# Patient Record
Sex: Female | Born: 1980
Health system: Southern US, Community
[De-identification: ages and names within clinical notes are randomized; demographics above are authoritative.]

## PROBLEM LIST (undated history)

## (undated) DIAGNOSIS — S2249XA Multiple fractures of ribs, unspecified side, initial encounter for closed fracture: Secondary | ICD-10-CM

## (undated) DIAGNOSIS — S82209A Unspecified fracture of shaft of unspecified tibia, initial encounter for closed fracture: Secondary | ICD-10-CM

## (undated) DIAGNOSIS — S82409A Unspecified fracture of shaft of unspecified fibula, initial encounter for closed fracture: Secondary | ICD-10-CM

## (undated) DIAGNOSIS — S62109A Fracture of unspecified carpal bone, unspecified wrist, initial encounter for closed fracture: Secondary | ICD-10-CM

## (undated) DIAGNOSIS — G43909 Migraine, unspecified, not intractable, without status migrainosus: Secondary | ICD-10-CM

## (undated) DIAGNOSIS — R112 Nausea with vomiting, unspecified: Secondary | ICD-10-CM

## (undated) DIAGNOSIS — Z9889 Other specified postprocedural states: Secondary | ICD-10-CM

## (undated) DIAGNOSIS — J45909 Unspecified asthma, uncomplicated: Secondary | ICD-10-CM

## (undated) DIAGNOSIS — M8430XA Stress fracture, unspecified site, initial encounter for fracture: Secondary | ICD-10-CM

## (undated) DIAGNOSIS — S2239XA Fracture of one rib, unspecified side, initial encounter for closed fracture: Secondary | ICD-10-CM

## (undated) HISTORY — DX: Fracture of one rib, unspecified side, initial encounter for closed fracture: S22.39XA

## (undated) HISTORY — DX: Other specified postprocedural states: R11.2

## (undated) HISTORY — DX: Stress fracture, unspecified site, initial encounter for fracture: M84.30XA

## (undated) HISTORY — DX: Unspecified fracture of shaft of unspecified tibia, initial encounter for closed fracture: S82.209A

## (undated) HISTORY — DX: Migraine, unspecified, not intractable, without status migrainosus: G43.909

## (undated) HISTORY — DX: Multiple fractures of ribs, unspecified side, initial encounter for closed fracture: S22.49XA

## (undated) HISTORY — DX: Other specified postprocedural states: Z98.890

## (undated) HISTORY — DX: Unspecified fracture of shaft of unspecified tibia, initial encounter for closed fracture: S82.409A

## (undated) HISTORY — DX: Unspecified asthma, uncomplicated: J45.909

## (undated) HISTORY — DX: Fracture of unspecified carpal bone, unspecified wrist, initial encounter for closed fracture: S62.109A

---
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
## 2002-09-29 ENCOUNTER — Other Ambulatory Visit: Admission: RE | Admit: 2002-09-29 | Discharge: 2002-09-29 | Payer: Self-pay | Admitting: Obstetrics and Gynecology

## 2003-10-29 ENCOUNTER — Other Ambulatory Visit: Admission: RE | Admit: 2003-10-29 | Discharge: 2003-10-29 | Payer: Self-pay | Admitting: Obstetrics and Gynecology

## 2005-01-19 ENCOUNTER — Other Ambulatory Visit: Admission: RE | Admit: 2005-01-19 | Discharge: 2005-01-19 | Payer: Self-pay | Admitting: Obstetrics and Gynecology

## 2006-03-02 ENCOUNTER — Other Ambulatory Visit: Admission: RE | Admit: 2006-03-02 | Discharge: 2006-03-02 | Payer: Self-pay | Admitting: Obstetrics and Gynecology

## 2006-11-09 ENCOUNTER — Emergency Department (HOSPITAL_COMMUNITY): Admission: EM | Admit: 2006-11-09 | Discharge: 2006-11-09 | Payer: Self-pay | Admitting: Family Medicine

## 2007-02-28 ENCOUNTER — Other Ambulatory Visit: Admission: RE | Admit: 2007-02-28 | Discharge: 2007-02-28 | Payer: Self-pay | Admitting: Obstetrics and Gynecology

## 2008-01-26 ENCOUNTER — Emergency Department (HOSPITAL_BASED_OUTPATIENT_CLINIC_OR_DEPARTMENT_OTHER): Admission: EM | Admit: 2008-01-26 | Discharge: 2008-01-26 | Payer: Self-pay | Admitting: Emergency Medicine

## 2008-03-08 ENCOUNTER — Other Ambulatory Visit: Admission: RE | Admit: 2008-03-08 | Discharge: 2008-03-08 | Payer: Self-pay | Admitting: Obstetrics & Gynecology

## 2008-06-15 HISTORY — PX: OTHER SURGICAL HISTORY: SHX169

## 2008-07-12 ENCOUNTER — Ambulatory Visit (HOSPITAL_COMMUNITY): Admission: RE | Admit: 2008-07-12 | Discharge: 2008-07-12 | Payer: Self-pay | Admitting: Neurological Surgery

## 2008-07-12 ENCOUNTER — Encounter: Payer: Self-pay | Admitting: Orthopedic Surgery

## 2008-07-17 ENCOUNTER — Ambulatory Visit (HOSPITAL_COMMUNITY): Admission: RE | Admit: 2008-07-17 | Discharge: 2008-07-17 | Payer: Self-pay | Admitting: Orthopedic Surgery

## 2010-02-13 IMAGING — RF DG ANKLE 2V *R*
1 series · 2 of 2 positions shown · non-contrast
Comparison: CT of the right ankle of 07/12/2008

CLINICAL DATA: Fixation of distal tibial fracture

RIGHT ANKLE - 2 VIEW

[Series 1: run · 2 of 2 slices shown]
[im 1/2]
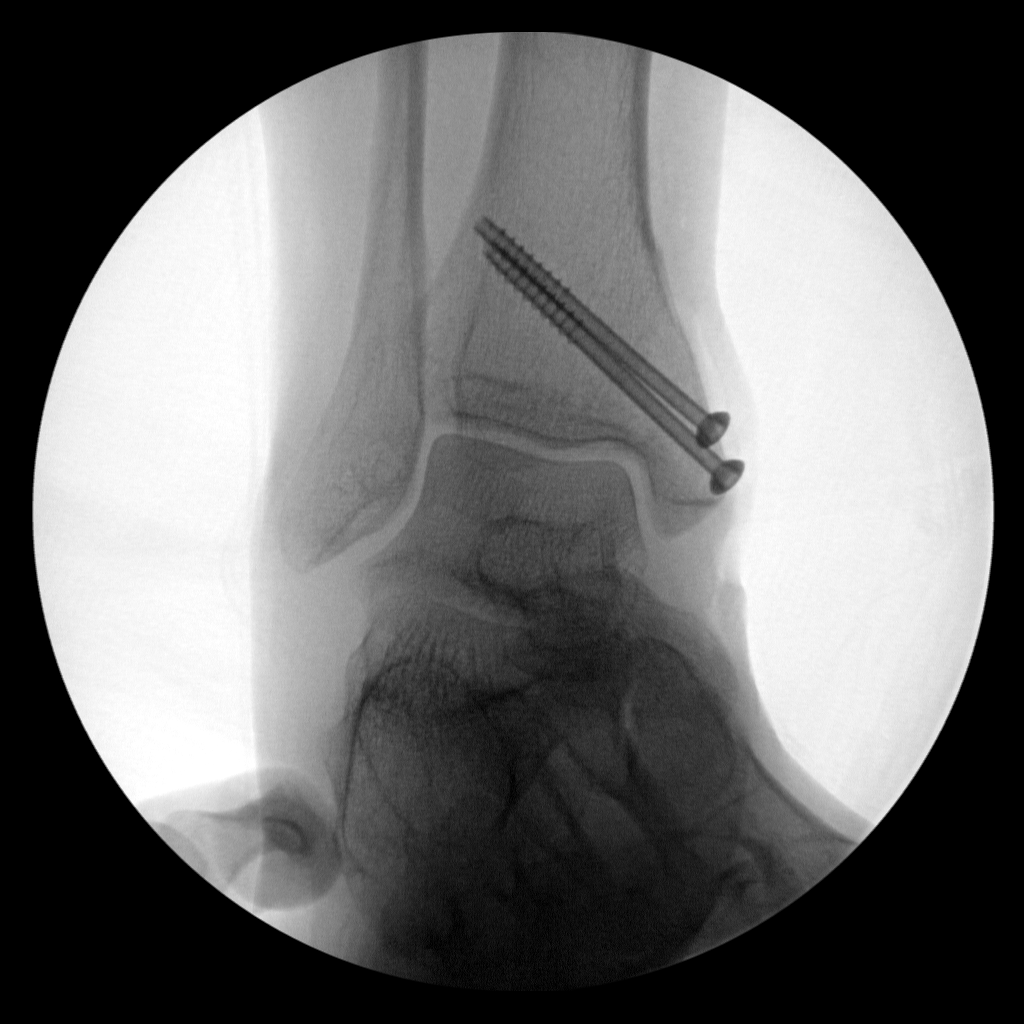
[im 2/2]
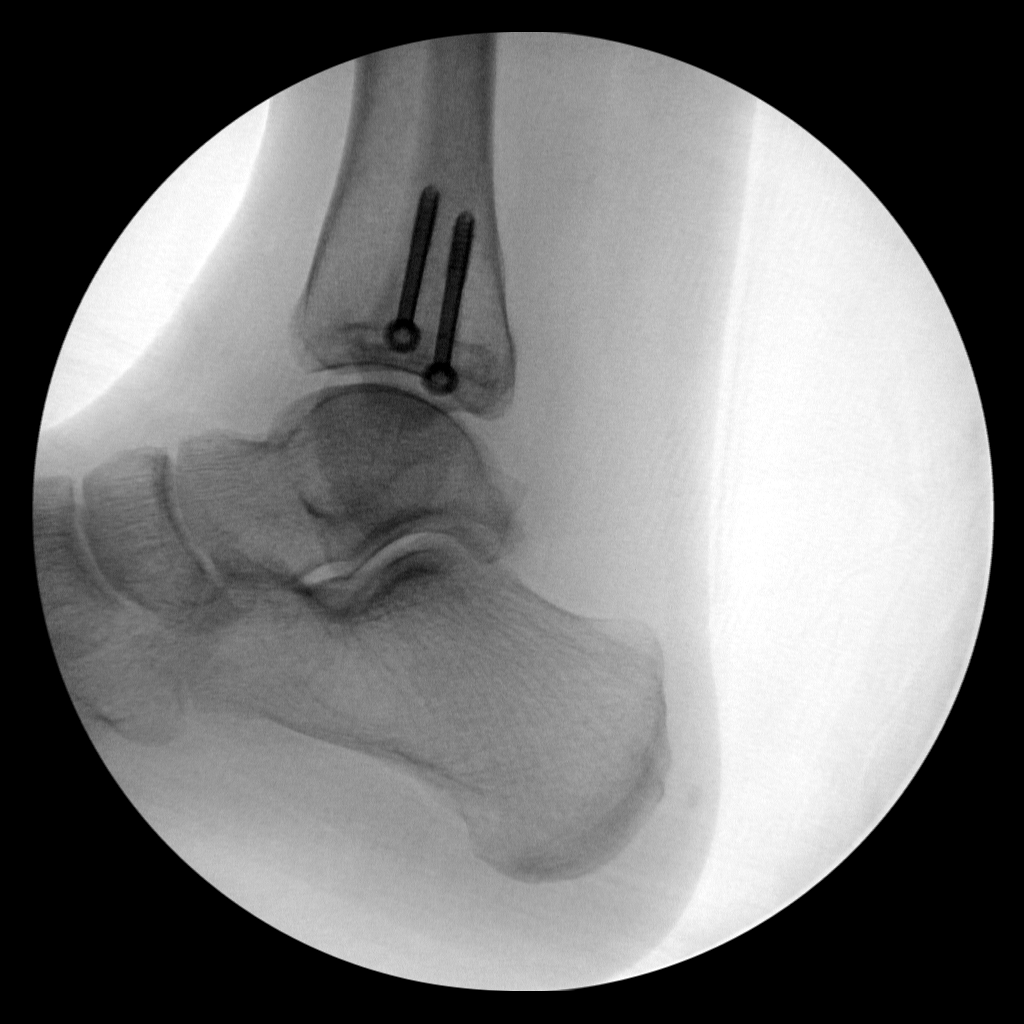

[2 of 2 positions shown; findings below may reference images not displayed]

FINDINGS: Two C-arm spot films show placement of two screws for
fixation of the distal tibial fracture.
IMPRESSION: Fixation of distal right tibial fracture.

REF:W2 DICTATED: 07/17/2008 [DATE]

## 2010-07-06 ENCOUNTER — Encounter: Payer: Self-pay | Admitting: Orthopedic Surgery

## 2010-09-30 LAB — URINALYSIS, ROUTINE W REFLEX MICROSCOPIC
Bilirubin Urine: NEGATIVE
Ketones, ur: NEGATIVE mg/dL
Nitrite: NEGATIVE
Urobilinogen, UA: 0.2 mg/dL (ref 0.0–1.0)
pH: 5.5 (ref 5.0–8.0)

## 2010-09-30 LAB — DIFFERENTIAL
Basophils Relative: 0 % (ref 0–1)
Eosinophils Absolute: 0.1 10*3/uL (ref 0.0–0.7)
Eosinophils Relative: 1 % (ref 0–5)
Monocytes Absolute: 0.4 10*3/uL (ref 0.1–1.0)
Monocytes Relative: 6 % (ref 3–12)
Neutrophils Relative %: 67 % (ref 43–77)

## 2010-09-30 LAB — PREGNANCY, URINE: Preg Test, Ur: NEGATIVE

## 2010-09-30 LAB — BASIC METABOLIC PANEL
CO2: 22 mEq/L (ref 19–32)
Chloride: 109 mEq/L (ref 96–112)
Creatinine, Ser: 0.74 mg/dL (ref 0.4–1.2)
GFR calc Af Amer: >60 mL/min (ref >60)

## 2010-09-30 LAB — CBC
MCHC: 33.4 g/dL (ref 30.0–36.0)
MCV: 88.3 fL (ref 78.0–100.0)
RBC: 4.95 MIL/uL (ref 3.87–5.11)

## 2010-10-28 NOTE — Op Note (Signed)
NAMEBRUCE, Katrina Parrish               ACCOUNT NO.:  [id number]   MEDICAL RECORD NO.:  [id number]          PATIENT TYPE:  AMB   LOCATION:  DAY                          FACILITY:  [facility]   PHYSICIAN:  Madlyn Frankel. Charlann Boxer, M.D.  DATE OF BIRTH:  08-15-1980   DATE OF PROCEDURE:  07/17/2008  DATE OF DISCHARGE:  07/17/2008                               OPERATIVE REPORT   PREOPERATIVE DIAGNOSIS:  Right bimalleolar ankle fracture with  displacement of the medial fragment extending down to the shoulder the  tibial tear articulation with some minor comminution.   POSTOPERATIVE DIAGNOSIS:  Right bimalleolar ankle fracture with  displacement of the medial fragment extending down to the shoulder the  tibial tear articulation with some minor comminution.   PROCEDURE PERFORMED:  Open reduction and internal fixation of right  bimalleolar ankle fracture with cannulated screw fixation of medial  malleolar segment.   SURGEON:  Madlyn Frankel. Charlann Boxer, M.D.   ASSISTANT:  Yetta Glassman. Mann, PA   ANESTHESIA:  General.   BLOOD LOSS:  Minimal.   TOURNIQUET TIME:  45 minutes at 2 mmHg.   INDICATIONS FOR PROCEDURE:  Gertrue is a 30 year old female who was riding  a horse when she was bucked off, landing onto her feet.  She had a  rolling mechanism injury to her right ankle.  She was seen and evaluated  in the emergency room, placed into a splint.  She was seen and evaluated  in follow-up in the office.  Radiographs at that time had revealed  concern for displacement of the fracture.  CT scan was ordered  confirming some displacement, as well as some minor comminution of the  shoulder portion of the joint and without significant fibular fracture  or injury.  Given these findings with displacement and intra-articular  involvement, it was felt best to stabilize this at her age to provide  security for fixation and reduced displacement.  Risks of infection,  stiffness, and need for removal of hardware were all discussed  and  reviewed.  Consent was obtained.   PROCEDURE IN DETAIL:  The patient was brought to the operative theater  where adequate anesthesia, preoperative antibiotics and Ancef were  administered.  The patient was positioned supine.  Time-out was  performed identifying the patient and extremity.  Thigh tourniquet was  used.   The right lower extremity pre scrubbed, prepped and draped in sterile  fashion.  The leg was exsanguinated, tourniquet elevated to 200 mmHg.  Landmarks were identified.  A slight J-type incision was made over the  medial malleolar segment.  Sharp dissection carried down to the extensor  and retinacular layer as well as periosteal layer.  This layer was then  elevated sharply.  Fracture site identified on fluoroscopic imaging.  Using a bone tenaculum I was able to reduce the fracture into anatomic  position to the mortise.  With it reduced I placed the 2 guide pins  across the fracture from the anterior medial to posterior lateral  direction, perpendicular to the  fracture.  I then used the countersink  device to try to sink the screw  head in the medial bone to prevent  excessive prominence of the screws.  I then placed two wires for the  4-  0 cannulated screws.  There was excellent reduction of the fracture and  maintenance of the reduction with the removal of the bone tenaculum  under fluoroscopic imaging.  The screws were then placed.  The wounds  were irrigated.  I was able to reapproximate this retinacular tissue  over the top of the screw heads.  The screws was still slightly palpable  at this point.  The remainder of the wound was closed with 3-0 Vicryl  and 3-0 nylon on the skin edges.  The skin was cleaned, dried, dressed  sterilely with Xeroform and bulky wrap.  It was then covered with a L  and U splint.  The patient was awoken from anesthesia,  brought to the  recovery room in stable condition, having tolerated the procedure well.   It should be noted  that she will be seen in followup.  She will be non-  weight-bearing until seen in follow up.  She will be non-weight-bearing  for the 5 weeks.  Pain medicine was prescribed.      Madlyn Frankel Charlann Boxer, M.D.  Electronically Signed     MDO/MEDQ  D:  07/17/2008  T:  07/18/2008  Job:  [id number]

## 2013-01-02 ENCOUNTER — Ambulatory Visit (INDEPENDENT_AMBULATORY_CARE_PROVIDER_SITE_OTHER): Payer: PRIVATE HEALTH INSURANCE | Admitting: Certified Nurse Midwife

## 2013-01-02 ENCOUNTER — Encounter: Payer: Self-pay | Admitting: Certified Nurse Midwife

## 2013-01-02 ENCOUNTER — Telehealth: Payer: Self-pay | Admitting: Certified Nurse Midwife

## 2013-01-02 VITALS — BP 110/60 | HR 80 | Resp 18 | Ht 65.0 in | Wt 128.8 lb

## 2013-01-02 DIAGNOSIS — Z0189 Encounter for other specified special examinations: Secondary | ICD-10-CM

## 2013-01-02 DIAGNOSIS — Z3009 Encounter for other general counseling and advice on contraception: Secondary | ICD-10-CM

## 2013-01-02 LAB — CBC
HCT: 41 % (ref 36.0–46.0)
Hemoglobin: 13.6 g/dL (ref 12.0–15.0)
MCH: 29.2 pg (ref 26.0–34.0)
MCHC: 33.2 g/dL (ref 30.0–36.0)
MCV: 88.2 fL (ref 78.0–100.0)

## 2013-01-02 NOTE — Telephone Encounter (Signed)
Patient would like to change birth control

## 2013-01-02 NOTE — Telephone Encounter (Signed)
Patient on POP due to migraines with aura, unable to take estrogen pills.  Really having a lot of BTB, more days of bleeding than not.  No missed or late pills. No UPT done.  Advised needs OV, schediled for today at 2pm.

## 2013-01-02 NOTE — Progress Notes (Signed)
32 y.o. Single Caucasian G0P0000here for evaluation of Micronor initiated on March 08-25-12 for contraception( change from Nuvaring(given by another provider) due to Migraine with aura) Menses duration 4 days with light to moderate  Flow, some cramping.Patient taking medication as prescribed. Denies missed pills, headaches, nausea, DVT warning signs or symptoms,   or other changes.   Keeping menses calendar.Patient complaining of increase spotting and increase at times of bleeding when period occurs. Interested in other options. Has used Depo Provera in past without problems, but planning marriage next year and considering trying to conceive soon after. Patient had long interval of amenorrhea after discontinuing(6-7 months?) when used before. Aware of bleeding profile with Micronor and no headache occurrence now!.Just wanted to talk about options today. No exam. Partner will finish PA school soon also. No other health issues today  O: Healthy female, WD WN Affect: normal orientation X 3    A: History of increasing headaches and aura occurrence with Nuvaring, all have stopped with Micronor use, working well 2-Contraceptive options available for patient 3-R/O anemia  P: Continue Micronor as prescribed at present, has Rx. 2-Discussed risks, benefits, insertion, removal, bleeding profile, fertility profile after removal of Mirena,Sklyla IUD, Nexplanon. Questions addressed at length. Discussed not hormonal options, condoms, diaphragm, sponge, spermicidal creams, and jelly. Cervical cap now available again. Questions addressed. Patient and partner will discuss and decide if changing types best choice. Discussed Motrin use for cramping may decrease bleeding profile also. Has handout on all of the above. (Patient PA) 3-Check CBC  Rv prn   35 minutes spent with patient with >50% of time spent in face to face counseling.  Reviewed, TL

## 2013-01-03 ENCOUNTER — Encounter: Payer: Self-pay | Admitting: Certified Nurse Midwife

## 2013-01-03 DIAGNOSIS — Z0189 Encounter for other specified special examinations: Secondary | ICD-10-CM | POA: Insufficient documentation

## 2013-04-20 ENCOUNTER — Other Ambulatory Visit: Payer: Self-pay

## 2013-05-31 ENCOUNTER — Encounter: Payer: Self-pay | Admitting: Certified Nurse Midwife

## 2013-06-01 ENCOUNTER — Telehealth: Payer: Self-pay | Admitting: Emergency Medicine

## 2013-06-01 NOTE — Telephone Encounter (Signed)
Message left to return call to [place] at [phone].   Needs OV with Verner Chol CNM

## 2013-06-01 NOTE — Telephone Encounter (Signed)
[facility] MESSAGE REPORT Message [[id number]]    From SHEMIKA ROBBS   To Verner Chol, CNM [[id number] [phone]]   Composed 05/31/2013 6:54 AM   For Delivery On 05/31/2013 6:54 AM   Subject Non-Urgent Medical Question   Message Type Patient Medical Advice Request   Read Status Y   Message Body I would like to go back on Depo as I'm continuing to have irregular bleeding pretty much constantly with either the Mini pill or nothing at all. Can I make an appointment with the nurse or do I need to see Mrs. Darcel Bayley again?   Thank you,  Katrina Nine, PA-C

## 2013-06-02 NOTE — Telephone Encounter (Signed)
Appointment scheduled.

## 2013-06-14 ENCOUNTER — Ambulatory Visit (INDEPENDENT_AMBULATORY_CARE_PROVIDER_SITE_OTHER): Payer: PRIVATE HEALTH INSURANCE | Admitting: Certified Nurse Midwife

## 2013-06-14 ENCOUNTER — Encounter: Payer: Self-pay | Admitting: Certified Nurse Midwife

## 2013-06-14 VITALS — BP 120/80 | HR 72 | Resp 16 | Ht 65.0 in | Wt 126.0 lb

## 2013-06-14 DIAGNOSIS — N949 Unspecified condition associated with female genital organs and menstrual cycle: Secondary | ICD-10-CM

## 2013-06-14 DIAGNOSIS — N938 Other specified abnormal uterine and vaginal bleeding: Secondary | ICD-10-CM

## 2013-06-14 DIAGNOSIS — N760 Acute vaginitis: Secondary | ICD-10-CM

## 2013-06-14 DIAGNOSIS — Z202 Contact with and (suspected) exposure to infections with a predominantly sexual mode of transmission: Secondary | ICD-10-CM

## 2013-06-14 DIAGNOSIS — B3731 Acute candidiasis of vulva and vagina: Secondary | ICD-10-CM

## 2013-06-14 DIAGNOSIS — B373 Candidiasis of vulva and vagina: Secondary | ICD-10-CM

## 2013-06-14 MED ORDER — METRONIDAZOLE 500 MG PO TABS: 500.0000 mg | ORAL_TABLET | Freq: Two times a day (BID) | ORAL | Status: DC

## 2013-06-14 MED ORDER — NYSTATIN-TRIAMCINOLONE 100000-0.1 UNIT/GM-% EX CREA: 1.0000 "application " | TOPICAL_CREAM | Freq: Two times a day (BID) | CUTANEOUS | Status: DC

## 2013-06-14 NOTE — Progress Notes (Signed)
32 y.o. Single Caucasian G0P0000here for evaluation of Micronor initiated on 08/25/12 for contraception due to Migraine with aura. Previous Nuvaring use without problems. Menses irregular with POP, so patient stopped to see if irregular spotting with decrease, which it did. Patient stayed off Micronor for 2 months with change with spotting , which she is aware is the bleeding profile with POP. Previous Depo Provera use without spotting. Patient getting married in 5/15 and would like to have spotting to cease. Considering other contraception option. Denies STD concerns but requested Gc,Chlamydia today. Also having brown discharge with intercourse. Some vaginal itching, no discharge. No new personal products, but does use thongs at times.Patient was taking medication as prescribed. Denies missed pills. No other health issues today  O: Healthy female, WD WN Affect: normal orientation X 3   Abdomen: non tender Pelvic Exam: External genitalia: increase pink with slight scaling appearance, no lesions noted wet prep taken BUS: negative Vagina: scant red blood with grey odorous discharge, wet prep taken. Ph 5.0 Cervix: non tender,normal appearance, scant blood from cervix, -CMT Uterus: non tender, normal, mid position Adnexa: normal, no masses, tenderness Perineal area: normal appearance no lesions  Wet Prep: positive for yeast external only, positive for BV, negative for trich   A: History of DUB with Micronor use, desires change in contraception 2-BV 3-Yeast vulvitis 4-STD screening P:Discussed any with another progesterone option she may have spotting. Patient plans to stay off POP with consistent condom use to see if it stops, it was better before. Would consider Depo Provera again, but not until after 2 months off Micronor. Patient not interested in Paragard IUD. Patient does not want long term contraception, may want to conceive in next year. Patient aware of Depo affect with fertility and  irregular bleeding.  (Patient a PA) Patient will advise status and decision of contraception if she desires. 2-Reviewed finding with patient and discussed can have  brown discharge when present. Rx Flagyl see order 3-Discussed findings and that constant use of pads can contribute also. Questions addressed. Rx Mycolog see order 4-Lab: GC,Chlamydia  30 minutes spent with patient with >50% of time spent in face to face counseling.  RV prn

## 2013-06-14 NOTE — Progress Notes (Signed)
Encounter reviewed by Dr. Breindel Collier Silva.  

## 2013-06-17 LAB — IPS N GONORRHOEA AND CHLAMYDIA BY PCR

## 2013-07-28 DIAGNOSIS — L811 Chloasma: Secondary | ICD-10-CM | POA: Insufficient documentation

## 2013-07-28 DIAGNOSIS — M419 Scoliosis, unspecified: Secondary | ICD-10-CM | POA: Insufficient documentation

## 2014-03-20 ENCOUNTER — Encounter: Payer: Self-pay | Admitting: Certified Nurse Midwife

## 2014-03-30 ENCOUNTER — Other Ambulatory Visit: Payer: Self-pay

## 2014-04-12 ENCOUNTER — Encounter: Payer: Self-pay | Admitting: Certified Nurse Midwife

## 2014-04-12 ENCOUNTER — Ambulatory Visit (INDEPENDENT_AMBULATORY_CARE_PROVIDER_SITE_OTHER): Payer: PRIVATE HEALTH INSURANCE | Admitting: Certified Nurse Midwife

## 2014-04-12 VITALS — BP 110/62 | HR 64 | Resp 16 | Ht 65.25 in | Wt 125.0 lb

## 2014-04-12 DIAGNOSIS — Z124 Encounter for screening for malignant neoplasm of cervix: Secondary | ICD-10-CM

## 2014-04-12 DIAGNOSIS — Z01419 Encounter for gynecological examination (general) (routine) without abnormal findings: Secondary | ICD-10-CM

## 2014-04-12 DIAGNOSIS — Z30011 Encounter for initial prescription of contraceptive pills: Secondary | ICD-10-CM

## 2014-04-12 MED ORDER — NORETHINDRONE 0.35 MG PO TABS: 1.0000 | ORAL_TABLET | Freq: Every day | ORAL | Status: DC
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
## 2014-04-12 NOTE — Patient Instructions (Signed)

## 2014-04-13 NOTE — Progress Notes (Signed)
Reviewed personally.  M. Suzanne Alva Broxson, MD.  

## 2014-04-16 LAB — IPS PAP TEST WITH HPV

## 2014-09-12 ENCOUNTER — Ambulatory Visit
Admission: RE | Admit: 2014-09-12 | Discharge: 2014-09-12 | Disposition: A | Discharge status: Home / Self Care | Payer: PRIVATE HEALTH INSURANCE | Source: Ambulatory Visit | Attending: Sports Medicine | Admitting: Sports Medicine

## 2014-09-12 ENCOUNTER — Other Ambulatory Visit: Payer: Self-pay | Admitting: *Deleted

## 2014-09-12 DIAGNOSIS — M25562 Pain in left knee: Secondary | ICD-10-CM

## 2014-09-17 ENCOUNTER — Ambulatory Visit (INDEPENDENT_AMBULATORY_CARE_PROVIDER_SITE_OTHER): Payer: PRIVATE HEALTH INSURANCE | Admitting: Sports Medicine

## 2014-09-17 ENCOUNTER — Encounter: Payer: Self-pay | Admitting: Sports Medicine

## 2014-09-17 VITALS — BP 121/63 | HR 70 | Ht 65.0 in | Wt 125.0 lb

## 2014-09-17 DIAGNOSIS — S83005A Unspecified dislocation of left patella, initial encounter: Secondary | ICD-10-CM

## 2014-09-17 NOTE — Progress Notes (Signed)
   Subjective:    Patient ID: Katrina Parrish, female    DOB: 1980-10-03, 34 y.o.   MRN: [id number]  HPI chief complaint: Left knee pain  [name] comes in today complaining of 2 weeks of left knee pain. She suffered a transient patellar dislocation while riding a horse. Her left knee struck the hip of another course forcing her left knee into extreme external rotation. Her patella instantly dislocated and she was able to self reduce. She developed tremendous swelling several hours later. Since then she has had persistent severe medial sided knee pain as well as an inability to completely extend the knee. Her swelling has improved with the help of a compression sleeve. She has had previous injuries to her MCL in this knee but no prior knee surgeries. In addition to her pain she is also getting some feelings of instability. No numbness and tingling. She is here today with her husband. Medical history reviewed Medications reviewed Allergies reviewed    Review of Systems     Objective:   Physical Exam Well-developed, fit appearing. No acute distress. Awake alert and oriented 3. Vital signs reviewed.  Left knee: Patient has approximately a 10 extension lag. Flexion is to about 120. No effusion. Positive patellar apprehension. 1-2+ laxity with MCL stressing. She is diffusely tender to palpation along the medial aspect of her knee. Pain with McMurray's. Lachman's testing is limited due to pain and guarding. Slight laxity with anterior drawer. Posterior drawer intact. Neurovascularly intact distally. Walking with a limp.  X-rays of the left knee including AP, lateral, and sunrise views show no obvious fracture.       Assessment & Plan:  Left knee pain status post patellar dislocation-rule out anterior cruciate ligament injury, meniscal tear  MRI of the left knee to further delineate pathology. I will follow-up with her with those results once available. In the meantime she will continue with her  compression sleeve. She does not tolerate any sort of hinged brace including a patellar stabilizer very well due to her medial knee pain. We will delineate definitive treatment based on her MRI findings.

## 2014-09-19 ENCOUNTER — Other Ambulatory Visit: Payer: PRIVATE HEALTH INSURANCE

## 2014-09-20 ENCOUNTER — Telehealth: Payer: Self-pay | Admitting: Sports Medicine

## 2014-09-20 NOTE — Telephone Encounter (Signed)
Patient returns to the office today at my request to discuss the MRI findings of her left knee. There is evidence of a transient patellar dislocation. Although there is an interstitial tear of the medial patellofemoral retinaculum there does not appear to be a complete disruption of the retinaculum itself or the medial patellofemoral ligament. There is no obvious chondral fracture. No loose body. Cruciate ligaments are intact. There is evidence of an MCL sprain as well as some soft tissue swelling associated with her patellar dislocation. There is some bruising along the lateral femoral condyle but it is along the nonweightbearing surface and there is no evidence of fracture. Based on these findings we will pursue conservative treatment of her patellar dislocation. We will fit her today with a patellar stabilizer brace and she will start a home exercise program consisting of range of motion exercises, isometric quad exercises, quarter squats. And wall sits. Patient will follow-up with me in 3 weeks. I think that she is safe to compete in next week's polo match wearing her patellar stabilizer brace. She does understand this does not mean that she is not at risk of reinjury.

## 2014-09-21 ENCOUNTER — Encounter: Payer: Self-pay | Admitting: Sports Medicine

## 2014-10-08 ENCOUNTER — Ambulatory Visit (INDEPENDENT_AMBULATORY_CARE_PROVIDER_SITE_OTHER): Payer: PRIVATE HEALTH INSURANCE | Admitting: Sports Medicine

## 2014-10-08 ENCOUNTER — Encounter: Payer: Self-pay | Admitting: Sports Medicine

## 2014-10-08 VITALS — BP 95/45 | HR 96 | Ht 65.0 in | Wt 125.0 lb

## 2014-10-08 DIAGNOSIS — S83005D Unspecified dislocation of left patella, subsequent encounter: Secondary | ICD-10-CM

## 2014-10-08 NOTE — Progress Notes (Signed)
   Subjective:    Patient ID: Katrina Parrish, female    DOB: [birth year], 34 y.o.   MRN: [id number]  HPI   Patient comes in today for follow-up on a left knee patellar dislocation. Overall she is improving. She has been wearing her patellar stabilizer brace and doing her home exercises. What pain she is experiencing is still along the medial knee. No returning swelling.    Review of Systems     Objective:   Physical Exam Well-developed, well-nourished.  Left knee: Full range of motion. Negative patellar apprehension. There is still tenderness to palpation diffusely along the medial knee along the course of the medial patellofemoral ligament as well as along the medial femoral condyle. No effusion. Good stability. Neurovascular intact distally.       Assessment & Plan:  Status post left knee patellar dislocation  Patient will start formal physical therapy. Continue with her patellar stabilizer for another 3 weeks but she may start to wean from it during this time especially when she is at home. She will need to continue with her patellar stabilizer with athletics for several more weeks. She will call with questions or concerns prior to her follow-up visit. I

## 2014-10-29 ENCOUNTER — Ambulatory Visit (INDEPENDENT_AMBULATORY_CARE_PROVIDER_SITE_OTHER): Payer: PRIVATE HEALTH INSURANCE | Admitting: Sports Medicine

## 2014-10-29 ENCOUNTER — Encounter: Payer: Self-pay | Admitting: Sports Medicine

## 2014-10-29 VITALS — BP 113/57 | HR 77 | Ht 65.0 in | Wt 125.0 lb

## 2014-10-29 DIAGNOSIS — S83005D Unspecified dislocation of left patella, subsequent encounter: Secondary | ICD-10-CM | POA: Diagnosis not present

## 2014-10-29 NOTE — Progress Notes (Signed)
   Subjective:    Patient ID: Katrina Parrish, female    DOB: Aug 21, 1980, 34 y.o.   MRN: [id number]  HPI Patient is a 34 yo female presenting for follow-up of left patellar dislocation. She continues to improve. Notes that she has not discomfort at rest at this time. She does note mild discomfort with hyperextension of her knee and occasionally when she plants and pivots on the left leg. Has been transitioning out of the brace and not wearing it at home. Still wearing while competing. Went to PT one time and has follow-up appointment later this week. Has been doing home exercises. No medications for pain. No return of swelling. Has continued polo cross.    Review of Systems     Objective:   Physical Exam  Constitutional: She appears well-developed and well-nourished.  HENT:  Head: Normocephalic and atraumatic.  Musculoskeletal:  Left knee: no swelling or erythema, full ROM, mild apprehension, no joint line tenderness, no ligamentous laxity, 4+/5 strength in quads Right knee: no swelling or erythema, full ROM, no apprehension,  no joint line tenderness, no ligamentous laxity, 5/5 strength in quads Neurovascularly intact       Assessment & Plan:  S/p left knee patellar dislocation  Progressing well. Quad strength not quite back to normal. Will need to continue exercises to increase strengthening. Is to keep f/u appointment with PT. Is to wear patella stabilizer while competing until the end of the season, otherwise can continue out of stabilizer. F/u as needed.   Marikay AlarEric Sonnenberg, MD [facility] PGY3  Patient was seen and evaluated with the above resident. I agree with the above plan of care. Patient is doing well. Continue to advance physical therapy and follow-up with me for ongoing or recalcitrant issues.

## 2015-04-17 ENCOUNTER — Ambulatory Visit (INDEPENDENT_AMBULATORY_CARE_PROVIDER_SITE_OTHER): Payer: PRIVATE HEALTH INSURANCE | Admitting: Certified Nurse Midwife

## 2015-04-17 ENCOUNTER — Encounter: Payer: Self-pay | Admitting: Certified Nurse Midwife

## 2015-04-17 VITALS — BP 110/70 | HR 70 | Resp 16 | Ht 65.25 in | Wt 128.0 lb

## 2015-04-17 DIAGNOSIS — N938 Other specified abnormal uterine and vaginal bleeding: Secondary | ICD-10-CM | POA: Diagnosis not present

## 2015-04-17 DIAGNOSIS — Z01419 Encounter for gynecological examination (general) (routine) without abnormal findings: Secondary | ICD-10-CM | POA: Diagnosis not present
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
## 2015-04-17 NOTE — Patient Instructions (Signed)

## 2015-04-23 ENCOUNTER — Telehealth: Payer: Self-pay | Admitting: Obstetrics & Gynecology

## 2015-04-23 NOTE — Telephone Encounter (Signed)
Spoke with patient. Reviewed benefits for pelvic ultrasound and sonohysterogram. Patient understood and agreeable. Patient ready to schedule. Patient scheduled with Dr Katrina Parrish on 04/25/15 @230pm . Patient aware of 72 hour cancellation policy with $150 fee. Patient agreeable to arrival date/time. No further questions. Ok to close.

## 2015-04-25 ENCOUNTER — Ambulatory Visit (INDEPENDENT_AMBULATORY_CARE_PROVIDER_SITE_OTHER): Payer: PRIVATE HEALTH INSURANCE | Admitting: Obstetrics & Gynecology

## 2015-04-25 ENCOUNTER — Ambulatory Visit (INDEPENDENT_AMBULATORY_CARE_PROVIDER_SITE_OTHER): Payer: PRIVATE HEALTH INSURANCE

## 2015-04-25 ENCOUNTER — Other Ambulatory Visit: Payer: Self-pay | Admitting: Obstetrics & Gynecology

## 2015-04-25 VITALS — BP 108/68 | HR 72 | Resp 12 | Wt 128.0 lb

## 2015-04-25 DIAGNOSIS — N938 Other specified abnormal uterine and vaginal bleeding: Secondary | ICD-10-CM

## 2015-04-25 DIAGNOSIS — G43109 Migraine with aura, not intractable, without status migrainosus: Secondary | ICD-10-CM

## 2015-04-25 DIAGNOSIS — Q519 Congenital malformation of uterus and cervix, unspecified: Secondary | ICD-10-CM

## 2015-04-25 NOTE — Progress Notes (Signed)
34 y.o.Marriedfemale here for a pelvic ultrasound with sonohystogram due to DUB.  Pt is not on any contraception but uses condoms all of the time.  She typically have a normal cycle lasting 2-3 days but will spot for 4-5 days before cycle and then spot again for another 5 days after cycle has actually stopped.  So, she has three separate bleeding episodes each month and has bleeding and/or spotting for almost half the month.  Has tried motrin without success.  H/O migraines with aura that is under good control so pt is not a candidate for combination OCPs.  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  Has considered micronor but PUS being performed first.  Patient's last menstrual period was 04/15/2015.  Contraception: faithful condom use   Technique:  Both transabdominal and transvaginal ultrasound examinations of the pelvis were performed. Transabdominal technique was performed for global imaging of the pelvis including uterus, ovaries, adnexal regions, and pelvic cul-de-sac.  It was necessary to proceed with endovaginal exam following the abdominal ultrasound transabdominal exam to visualize the endometrium and adnexa.  Color and duplex Doppler ultrasound was utilized to evaluate blood flow to the ovaries.   FINDINGS: Uterus: 7.0 x 2.8 x 2.9cm Endometrium: 6.548mm Adnexa:  Left: 2.3 x 1.6 x 1.0     Right: 4.0 x 2.7 x 2.3cm with 1.7cm collapsing corpus luteal cyst Cul de sac: no free fluid  SHSG:  After obtaining appropriate verbal consent from patient, the cervix was visualized using a speculum, and prepped with betadine.  A tenaculum  was applied to the cervix.  Dilation of the cervix was not necessary. The catheter was passed into the uterus and sterile saline introduced, with the following findings: thin 1.624mm septum present.  This does not appear to be undermined endometrial tissue.   Biopsy not recommended but hysteroscopy with excision of this abnormal appearing tissue was recommended.  Procedure  discussed with patient.  Recovery and pain management discussed.  Risks discussed including but not limited to bleeding, rare risk of transfusion, infection, 1% risk of uterine perforation with risks of fluid deficit causing cardiac arrythmia, cerebral swelling and/or need to stop procedure early.  Fluid emboli and rare risk of death discussed.  DVT/PE, rare risk of risk of bowel/bladder/ureteral/vascular injury.  Patient aware if pathology abnormal she may need additional treatment.  All questions answered.    Assessment: DUB Possible 1.74mm thin, long septum  Plan:  Hysteroscopy with possible resection of septum discussed.  Pt is in agreement with plan.  Will proceed with scheduling and will check with insurance for pt information.  ~25 minutes spent with patient >50% of time was in face to face discussion of above.

## 2015-04-26 NOTE — Progress Notes (Signed)
Encounter reviewed Cadie Sorci, MD   

## 2015-04-28 ENCOUNTER — Encounter: Payer: Self-pay | Admitting: Obstetrics & Gynecology

## 2015-04-28 DIAGNOSIS — Q519 Congenital malformation of uterus and cervix, unspecified: Secondary | ICD-10-CM | POA: Insufficient documentation

## 2015-04-28 DIAGNOSIS — G43109 Migraine with aura, not intractable, without status migrainosus: Secondary | ICD-10-CM | POA: Insufficient documentation

## 2015-04-28 DIAGNOSIS — Z8781 Personal history of (healed) traumatic fracture: Secondary | ICD-10-CM | POA: Insufficient documentation

## 2015-04-29 ENCOUNTER — Telehealth: Payer: Self-pay | Admitting: *Deleted

## 2015-04-29 ENCOUNTER — Other Ambulatory Visit: Payer: Self-pay | Admitting: Obstetrics & Gynecology

## 2015-04-29 NOTE — Telephone Encounter (Signed)
Call to patient to discuss available surgery date options. Patient is interested in proceeding next week if available pending call from business office. Advised will proceed with scheduling for next week and if she needs to reschedule she can let me know. Will call back with update later today.

## 2015-04-30 ENCOUNTER — Encounter (HOSPITAL_COMMUNITY): Payer: Self-pay | Admitting: *Deleted

## 2015-04-30 ENCOUNTER — Telehealth: Payer: Self-pay | Admitting: Certified Nurse Midwife

## 2015-04-30 ENCOUNTER — Telehealth: Payer: Self-pay | Admitting: *Deleted

## 2015-04-30 NOTE — Telephone Encounter (Signed)
Call to patient to review surgery instructions. Left message to call back.  

## 2015-04-30 NOTE — Telephone Encounter (Signed)
Completed review of benefits for surgery. Patient provided benefit payment over the phone. Patient scheduled. No further questions. Ok to close.

## 2015-04-30 NOTE — Telephone Encounter (Signed)
Returning a call to Becky

## 2015-05-01 NOTE — Telephone Encounter (Signed)
Patient returned call. Confirmed surgery date [facility] 05-06-[facility] arrive at 0800.  Surgery instruction sheet reviewed and printed copy mailed to patient. 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 Routing to provider for final review. Patient agreeable to disposition. Will close encounter.

## 2015-05-05 MED ORDER — DEXTROSE 5 % IV SOLN
2.0000 g | INTRAVENOUS | Status: AC
  Administered 2015-05-06: 2 g via INTRAVENOUS
  Filled 2015-05-05: qty 2

## 2015-05-06 ENCOUNTER — Ambulatory Visit (HOSPITAL_COMMUNITY)
Admission: RE | Admit: 2015-05-06 | Discharge: 2015-05-06 | Disposition: A | Discharge status: Home / Self Care | Payer: PRIVATE HEALTH INSURANCE | Source: Ambulatory Visit | Attending: Obstetrics & Gynecology | Admitting: Obstetrics & Gynecology

## 2015-05-06 ENCOUNTER — Ambulatory Visit (HOSPITAL_COMMUNITY): Payer: PRIVATE HEALTH INSURANCE | Admitting: Anesthesiology

## 2015-05-06 ENCOUNTER — Encounter (HOSPITAL_COMMUNITY)
Admission: RE | Disposition: A | Discharge status: Home / Self Care | Payer: Self-pay | Source: Ambulatory Visit | Attending: Obstetrics & Gynecology

## 2015-05-06 ENCOUNTER — Encounter (HOSPITAL_COMMUNITY): Payer: Self-pay | Admitting: *Deleted

## 2015-05-06 ENCOUNTER — Other Ambulatory Visit: Payer: Self-pay | Admitting: Obstetrics & Gynecology

## 2015-05-06 DIAGNOSIS — R938 Abnormal findings on diagnostic imaging of other specified body structures: Secondary | ICD-10-CM | POA: Diagnosis not present

## 2015-05-06 DIAGNOSIS — N938 Other specified abnormal uterine and vaginal bleeding: Secondary | ICD-10-CM | POA: Diagnosis not present

## 2015-05-06 DIAGNOSIS — N858 Other specified noninflammatory disorders of uterus: Secondary | ICD-10-CM | POA: Insufficient documentation

## 2015-05-06 HISTORY — PX: DILATATION & CURETTAGE/HYSTEROSCOPY WITH MYOSURE: SHX6511

## 2015-05-06 LAB — CBC
HCT: 38.1 % (ref 36.0–46.0)
HEMOGLOBIN: 12.9 g/dL (ref 12.0–15.0)
MCH: 29.9 pg (ref 26.0–34.0)
MCHC: 33.9 g/dL (ref 30.0–36.0)
MCV: 88.4 fL (ref 78.0–100.0)
PLATELETS: 231 10*3/uL (ref 150–400)
RBC: 4.31 MIL/uL (ref 3.87–5.11)
RDW: 12.3 % (ref 11.5–15.5)
WBC: 6.2 10*3/uL (ref 4.0–10.5)

## 2015-05-06 LAB — HCG, SERUM, QUALITATIVE: Preg, Serum: NEGATIVE

## 2015-05-06 SURGERY — DILATATION & CURETTAGE/HYSTEROSCOPY WITH MYOSURE
Anesthesia: General | Site: Vagina

## 2015-05-06 MED ORDER — KETOROLAC TROMETHAMINE 30 MG/ML IJ SOLN
INTRAMUSCULAR | Status: AC
  Filled 2015-05-06: qty 1

## 2015-05-06 MED ORDER — FENTANYL CITRATE (PF) 100 MCG/2ML IJ SOLN
INTRAMUSCULAR | Status: AC
  Filled 2015-05-06: qty 2

## 2015-05-06 MED ORDER — HYDROCODONE-ACETAMINOPHEN 5-325 MG PO TABS: 1.0000 | ORAL_TABLET | Freq: Four times a day (QID) | ORAL | Status: DC | PRN

## 2015-05-06 MED ORDER — PROPOFOL 10 MG/ML IV BOLUS
INTRAVENOUS | Status: DC | PRN
  Administered 2015-05-06: 150 mg via INTRAVENOUS

## 2015-05-06 MED ORDER — LIDOCAINE HCL (CARDIAC) 20 MG/ML IV SOLN
INTRAVENOUS | Status: DC | PRN
  Administered 2015-05-06: 80 mg via INTRAVENOUS

## 2015-05-06 MED ORDER — ONDANSETRON HCL 4 MG/2ML IJ SOLN
INTRAMUSCULAR | Status: AC
  Filled 2015-05-06: qty 2

## 2015-05-06 MED ORDER — MEPERIDINE HCL 25 MG/ML IJ SOLN: 6.2500 mg | INTRAMUSCULAR | Status: DC | PRN

## 2015-05-06 MED ORDER — LIDOCAINE-EPINEPHRINE 1 %-1:100000 IJ SOLN
INTRAMUSCULAR | Status: DC | PRN
  Administered 2015-05-06: 10 mL

## 2015-05-06 MED ORDER — PROPOFOL 10 MG/ML IV BOLUS
INTRAVENOUS | Status: AC
  Filled 2015-05-06: qty 20

## 2015-05-06 MED ORDER — MIDAZOLAM HCL 2 MG/2ML IJ SOLN
INTRAMUSCULAR | Status: AC
  Filled 2015-05-06: qty 2

## 2015-05-06 MED ORDER — FENTANYL CITRATE (PF) 250 MCG/5ML IJ SOLN
INTRAMUSCULAR | Status: DC | PRN
  Administered 2015-05-06 (×3): 50 ug via INTRAVENOUS
  Administered 2015-05-06: 100 ug via INTRAVENOUS

## 2015-05-06 MED ORDER — IBUPROFEN 800 MG PO TABS: 800.0000 mg | ORAL_TABLET | Freq: Three times a day (TID) | ORAL | Status: DC | PRN

## 2015-05-06 MED ORDER — KETOROLAC TROMETHAMINE 30 MG/ML IJ SOLN
INTRAMUSCULAR | Status: DC | PRN
  Administered 2015-05-06: 30 mg via INTRAVENOUS

## 2015-05-06 MED ORDER — SCOPOLAMINE 1 MG/3DAYS TD PT72
MEDICATED_PATCH | TRANSDERMAL | Status: AC
  Administered 2015-05-06: 1.5 mg via TRANSDERMAL
  Filled 2015-05-06: qty 1

## 2015-05-06 MED ORDER — ONDANSETRON HCL 4 MG/2ML IJ SOLN: 4.0000 mg | Freq: Once | INTRAMUSCULAR | Status: DC | PRN

## 2015-05-06 MED ORDER — FENTANYL CITRATE (PF) 100 MCG/2ML IJ SOLN
25.0000 ug | INTRAMUSCULAR | Status: DC | PRN
  Administered 2015-05-06: 25 ug via INTRAVENOUS

## 2015-05-06 MED ORDER — LACTATED RINGERS IV SOLN
INTRAVENOUS | Status: DC
  Administered 2015-05-06 (×2): via INTRAVENOUS

## 2015-05-06 MED ORDER — LIDOCAINE-EPINEPHRINE 1 %-1:100000 IJ SOLN
INTRAMUSCULAR | Status: AC
  Filled 2015-05-06: qty 1

## 2015-05-06 MED ORDER — MIDAZOLAM HCL 2 MG/2ML IJ SOLN
INTRAMUSCULAR | Status: DC | PRN
  Administered 2015-05-06: 2 mg via INTRAVENOUS

## 2015-05-06 MED ORDER — ONDANSETRON 4 MG PO TBDP: 4.0000 mg | ORAL_TABLET | Freq: Three times a day (TID) | ORAL | Status: DC | PRN

## 2015-05-06 MED ORDER — ONDANSETRON HCL 4 MG/2ML IJ SOLN
INTRAMUSCULAR | Status: DC | PRN
  Administered 2015-05-06: 4 mg via INTRAVENOUS

## 2015-05-06 MED ORDER — SODIUM CHLORIDE 0.9 % IR SOLN
Status: DC | PRN
  Administered 2015-05-06: 3000 mL

## 2015-05-06 MED ORDER — FENTANYL CITRATE (PF) 250 MCG/5ML IJ SOLN
INTRAMUSCULAR | Status: AC
  Filled 2015-05-06: qty 5

## 2015-05-06 MED ORDER — LIDOCAINE HCL (CARDIAC) 20 MG/ML IV SOLN
INTRAVENOUS | Status: AC
  Filled 2015-05-06: qty 5

## 2015-05-06 MED ORDER — KETOROLAC TROMETHAMINE 30 MG/ML IJ SOLN: 30.0000 mg | Freq: Once | INTRAMUSCULAR | Status: DC | PRN

## 2015-05-06 MED ORDER — DEXAMETHASONE SODIUM PHOSPHATE 4 MG/ML IJ SOLN
INTRAMUSCULAR | Status: DC | PRN
  Administered 2015-05-06: 4 mg via INTRAVENOUS

## 2015-05-06 MED ORDER — DEXAMETHASONE SODIUM PHOSPHATE 10 MG/ML IJ SOLN
INTRAMUSCULAR | Status: AC
  Filled 2015-05-06: qty 1

## 2015-05-06 MED ORDER — SCOPOLAMINE 1 MG/3DAYS TD PT72
1.0000 | MEDICATED_PATCH | Freq: Once | TRANSDERMAL | Status: DC
  Administered 2015-05-06: 1.5 mg via TRANSDERMAL

## 2015-05-06 SURGICAL SUPPLY — 24 items
CANISTER SUCT 3000ML (MISCELLANEOUS) ×3 IMPLANT
CATH ROBINSON RED A/P 16FR (CATHETERS) ×3 IMPLANT
CLOTH BEACON ORANGE TIMEOUT ST (SAFETY) ×3 IMPLANT
CONTAINER PREFILL 10% NBF 60ML (FORM) ×6 IMPLANT
DEVICE MYOSURE LITE (MISCELLANEOUS) IMPLANT
DEVICE MYOSURE REACH (MISCELLANEOUS) IMPLANT
DILATOR CANAL MILEX (MISCELLANEOUS) IMPLANT
DRAPE HYSTEROSCOPY (DRAPE) ×4 IMPLANT
ELECT REM PT RETURN 9FT ADLT (ELECTROSURGICAL)
ELECTRODE REM PT RTRN 9FT ADLT (ELECTROSURGICAL) IMPLANT
FILTER ARTHROSCOPY CONVERTOR (FILTER) ×3 IMPLANT
GLOVE BIOGEL PI IND STRL 7.0 (GLOVE) ×2 IMPLANT
GLOVE BIOGEL PI INDICATOR 7.0 (GLOVE) ×4
GLOVE ECLIPSE 6.5 STRL STRAW (GLOVE) ×6 IMPLANT
GOWN STRL REUS W/TWL LRG LVL3 (GOWN DISPOSABLE) ×6 IMPLANT
MYOSURE XL FIBROID REM (MISCELLANEOUS) ×3
PACK VAGINAL MINOR WOMEN LF (CUSTOM PROCEDURE TRAY) ×3 IMPLANT
PAD OB MATERNITY 4.3X12.25 (PERSONAL CARE ITEMS) ×3 IMPLANT
SEAL ROD LENS SCOPE MYOSURE (ABLATOR) ×3 IMPLANT
SYSTEM TISS REMOVAL MYSR XL RM (MISCELLANEOUS) IMPLANT
TOWEL OR 17X24 6PK STRL BLUE (TOWEL DISPOSABLE) ×6 IMPLANT
TUBING AQUILEX INFLOW (TUBING) ×3 IMPLANT
TUBING AQUILEX OUTFLOW (TUBING) ×3 IMPLANT
WATER STERILE IRR 1000ML POUR (IV SOLUTION) ×3 IMPLANT

## 2015-05-06 NOTE — Anesthesia Preprocedure Evaluation (Addendum)
Anesthesia Evaluation  Patient identified by MRN, date of birth, ID band Patient awake    Reviewed: Allergy & Precautions, H&P , NPO status , Patient's Chart, lab work & pertinent test results  Airway Mallampati: I  TM Distance: >3 FB Neck ROM: full    Dental no notable dental hx. (+) Teeth Intact   Pulmonary    Pulmonary exam normal        Cardiovascular negative cardio ROS Normal cardiovascular exam     Neuro/Psych negative psych ROS   GI/Hepatic negative GI ROS, Neg liver ROS,   Endo/Other  negative endocrine ROS  Renal/GU negative Renal ROS     Musculoskeletal   Abdominal Normal abdominal exam  (+)   Peds  Hematology negative hematology ROS (+)   Anesthesia Other Findings   Reproductive/Obstetrics negative OB ROS                            Anesthesia Physical Anesthesia Plan  ASA: II  Anesthesia Plan: General   Post-op Pain Management:    Induction: Intravenous  Airway Management Planned: Oral ETT  Additional Equipment:   Intra-op Plan:   Post-operative Plan: Extubation in OR  Informed Consent: I have reviewed the patients History and Physical, chart, labs and discussed the procedure including the risks, benefits and alternatives for the proposed anesthesia with the patient or authorized representative who has indicated his/her understanding and acceptance.   Dental Advisory Given  Plan Discussed with: CRNA and Surgeon  Anesthesia Plan Comments:         Anesthesia Quick Evaluation
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
## 2015-05-06 NOTE — Anesthesia Procedure Notes (Signed)
Procedure Name: LMA Insertion Date/Time: 05/06/2015 9:26 AM Performed by: Graciela HusbandsFUSSELL, Alfa Leibensperger O Pre-anesthesia Checklist: Emergency Drugs available, Patient identified, Timeout performed, Suction available and Patient being monitored Patient Re-evaluated:Patient Re-evaluated prior to inductionOxygen Delivery Method: Circle system utilized Preoxygenation: Pre-oxygenation with 100% oxygen Intubation Type: IV induction Ventilation: Mask ventilation without difficulty LMA: LMA inserted LMA Size: 4.0 Number of attempts: 1 Placement Confirmation: breath sounds checked- equal and bilateral and positive ETCO2 Tube secured with: Tape Dental Injury: Teeth and Oropharynx as per pre-operative assessment

## 2015-05-06 NOTE — Anesthesia Postprocedure Evaluation (Signed)
Anesthesia Post Note  Patient: Katrina Parrish  Procedure(s) Performed: Procedure(s) (LRB): DILATATION & CURETTAGE/HYSTEROSCOPY WITH RESECTION of endometrial mass  AND MYOSURE  (N/A)  Patient location during evaluation: PACU Anesthesia Type: General Level of consciousness: awake Pain management: pain level controlled Vital Signs Assessment: post-procedure vital signs reviewed and stable Respiratory status: spontaneous breathing Cardiovascular status: blood pressure returned to baseline Postop Assessment: No signs of nausea or vomiting Anesthetic complications: no    Last Vitals:  Filed Vitals:   05/06/15 1109 05/06/15 1115  BP:  107/73  Pulse: 73 75  Temp: 36.6 C   Resp: 18 16    Last Pain:  Filed Vitals:   05/06/15 1122  PainSc: 1                  Kaimen Peine JR,JOHN Izell Labat

## 2015-05-06 NOTE — Discharge Instructions (Signed)
Post-surgical Instructions, Outpatient Surgery ° °You may expect to feel dizzy, weak, and drowsy for as long as 24 hours after receiving the medicine that made you sleep (anesthetic). For the first 24 hours after your surgery:   °Do not drive a car, ride a bicycle, participate in physical activities, or take public transportation until you are done taking narcotic pain medicines or as directed by Dr. Lynlee Stratton.  °Do not drink alcohol or take tranquilizers.  °Do not take medicine that has not been prescribed by your physicians.  °Do not sign important papers or make important decisions while on narcotic pain medicines.  °Have a responsible person with you.  ° °PAIN MANAGEMENT °Motrin 800mg.  (This is the same as 4-200mg over the counter tablets of Motrin or ibuprofen.)  You may take this every eight hours or as needed for cramping.   °Vicodin 5/325mg.  For more severe pain, take one or two tablets every four to six hours as needed for pain control.  (Remember that narcotic pain medications increase your risk of constipation.  If this becomes a problem, you may take an over the counter stool softener like Colace 100mg up to four times a day.) ° °DO'S AND DON'T'S °Do not take a tub bath for one week.  You may shower on the first day after your surgery °Do not do any heavy lifting for one to two weeks.  This increases the chance of bleeding. °Do move around as you feel able.  Stairs are fine.  You may begin to exercise again as you feel able.  Do not lift any weights for two weeks. °Do not put anything in the vagina for two weeks--no tampons, intercourse, or douching.   ° °REGULAR MEDIATIONS/VITAMINS: °You may restart all of your regular medications as prescribed. °You may restart all of your vitamins as you normally take them.   ° °PLEASE CALL OR SEEK MEDICAL CARE IF: °You have persistent nausea and vomiting.  °You have trouble eating or drinking.  °You have an oral temperature above 100.5.  °You have constipation that is  not helped by adjusting diet or increasing fluid intake. Pain medicines are a common cause of constipation.  °You have heavy vaginal bleeding ° ° ° °

## 2015-05-06 NOTE — Anesthesia Postprocedure Evaluation (Signed)
Anesthesia Post Note  Patient: Katrina GuntherSarah C Rout  Procedure(s) Performed: Procedure(s) (LRB): DILATATION & CURETTAGE/HYSTEROSCOPY WITH RESECTION of endometrial mass  AND MYOSURE  (N/A)  Patient location during evaluation: PACU Anesthesia Type: General Level of consciousness: awake Pain management: pain level controlled Vital Signs Assessment: post-procedure vital signs reviewed and stable Respiratory status: spontaneous breathing Cardiovascular status: stable Postop Assessment: No signs of nausea or vomiting Anesthetic complications: no    Last Vitals:  Filed Vitals:   05/06/15 0803  BP: 113/56  Pulse: 81  Temp: 36.9 C  Resp: 20    Last Pain: There were no vitals filed for this visit.               Zeenat Jeanbaptiste JR,JOHN Susann GivensFRANKLIN

## 2015-05-06 NOTE — Transfer of Care (Signed)
Immediate Anesthesia Transfer of Care Note  Patient: Katrina GuntherSarah C Parrish  Procedure(s) Performed: Procedure(s): DILATATION & CURETTAGE/HYSTEROSCOPY WITH RESECTION of endometrial mass  AND MYOSURE  (N/A)  Patient Location: PACU  Anesthesia Type:General  Level of Consciousness: awake, alert  and oriented  Airway & Oxygen Therapy: Patient Spontanous Breathing and Patient connected to nasal cannula oxygen  Post-op Assessment: Report given to RN and Post -op Vital signs reviewed and stable  Post vital signs: Reviewed and stable  Last Vitals:  Filed Vitals:   05/06/15 0803  BP: 113/56  Pulse: 81  Temp: 36.9 C  Resp: 20    Complications: No apparent anesthesia complications

## 2015-05-06 NOTE — Op Note (Signed)
05/06/2015  10:35 AM  PATIENT:  Katrina Parrish  34 y.o. female  PRE-OPERATIVE DIAGNOSIS:  DUB, endometrial cystic mass on anterior wall  POST-OPERATIVE DIAGNOSIS:  DUB, endometrial cystic mass on anterior wall, thickened endometrium  PROCEDURE:  Procedure(s): DILATATION & CURETTAGE/HYSTEROSCOPY WITH RESECTION of endometrial mass  AND MYOSURE   SURGEON:  Izella Ybanez SUZANNE  ASSISTANTS: OR staff   ANESTHESIA:   general  ESTIMATED BLOOD LOSS: 10cc  BLOOD ADMINISTERED:none   FLUIDS: 1000cc LR  UOP: 50cc drained with I&O cath at beginning of procedure  SPECIMEN:  Endometrial curettings, endometrial cystic mass, Pap smear  DISPOSITION OF SPECIMEN:  PATHOLOGY  FINDINGS: thickened endometrium, cystic endometrial mass  DESCRIPTION OF OPERATION: Patient was taken to the operating room.  She is placed in the supine position. SCDs were on her lower extremities and functioning properly. General anesthesia with an LMA was administered without difficulty. Dr. Lilli LightHatchet oversaw case.  Legs were then placed in the [facility] stirrups in the low lithotomy position. The legs were lifted to the high lithotomy position and the Betadine prep was used on the inner thighs perineum and vagina x3. Patient was draped in a normal standard fashion. An in and out catheterization with a red rubber Foley catheter was performed. Approximately 50 cc of clear urine was noted. A bivalve speculum was placed the vagina. The anterior lip of the cervix was grasped with single-tooth tenaculum.  The cervix bled easily with touching so Pap smear was obtained.  A paracervical block of 1% lidocaine mixed one-to-one with epinephrine (1:100,000 units) was instilled.  10 cc was used total. The cervix is dilated up to [facility] dilators. The endometrial cavity sounded to 7 cm.   A 2.9 millimeter diagnostic hysteroscope was obtained.  Saline was used as the hysteroscopic fluid. The hysteroscope was advanced through the endocervical  canal into the endometrial cavity. No septum was noted.  Thickened endometrium was noted.  An anterior cystic mass was seen as well.  The hysteroscope was removed.  The endometrium was curetted until a rough gritty texture was noted in all quadrants.  The cavity was re visualized and the cystic finding was seen again.  Then the [facility] lite device was obtained and this lesion was fully resected without difficulty.  Photodocumentation was obtained.  At this point no other procedure was ended. The hysteroscope was removed. The fluid deficit was 235 cc. The tenaculum was removed from the anterior lip of the cervix. The speculum was returned vagina. The prep was cleansed of the patient's skin. The legs are positioned back in the supine position. Sponge, lap, needle, initially counts were correct x2. Patient was taken to recovery in stable condition.  COUNTS:  YES  PLAN OF CARE: Transfer to PACU

## 2015-05-07 ENCOUNTER — Encounter (HOSPITAL_COMMUNITY): Payer: Self-pay | Admitting: Obstetrics & Gynecology

## 2015-05-07 LAB — CYTOLOGY - PAP

## 2015-05-24 ENCOUNTER — Encounter: Payer: Self-pay | Admitting: Obstetrics & Gynecology

## 2015-05-24 ENCOUNTER — Ambulatory Visit (INDEPENDENT_AMBULATORY_CARE_PROVIDER_SITE_OTHER): Payer: PRIVATE HEALTH INSURANCE | Admitting: Obstetrics & Gynecology

## 2015-05-24 VITALS — BP 90/60 | HR 74 | Resp 16 | Ht 65.25 in | Wt 127.0 lb

## 2015-05-24 DIAGNOSIS — N938 Other specified abnormal uterine and vaginal bleeding: Secondary | ICD-10-CM

## 2015-05-24 NOTE — Progress Notes (Deleted)
Post Operative Visit  Procedure: DILATATION & CURETTAGE/HYSTEROSCOPY WITH RESECTION of endometrial mass AND MYOSURE.  Days Post-op: 18  Subjective: ***   Objective: BP 90/60 mmHg  Pulse 74  Resp 16  Ht 5' 5.25" (1.657 m)  Wt 127 lb (57.607 kg)  BMI 20.98 kg/m2  LMP 04/15/2015  EXAM General: {Exam; general:16600} Resp: {Exam; lung:16931} Cardio: {Exam; heart:5510} GI: {Exam, ZO:[id number]}GI:3041136} Extremities: {Exam; extremity:5109} Vaginal Bleeding: {exam; vaginal bleeding:3041122}  Assessment: s/p ***  Plan: Recheck {NUMBER 1-10:22536} weeks ***

## 2015-05-24 NOTE — Progress Notes (Signed)
Subjective:     Patient ID: Katrina Parrish, female   DOB: 06-Sep-1980, 34 y.o.   MRN: [id number]  HPI 34 yo G0 MWF here for follow up after hysteroscopy with myosure resection of endometrial mass/cyst, D&C.  Pictures and pathology reviewed with pt.  Endometrium was thickened with the D&C but pathology showed only secretory endometrium.  There was no septum.  D/w pt reasoning why I felt this was seen on ultrasound (probably undermining of endometirum with IUI catheter).    Pt wants to start trying for pregnancy in the spring.  Ovulation kits discussed to see if pt is ovulating.  She is currently not bleeding and is so happy about this.  Denies any pain or vaginal discharge.  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  Wants to know when to stop riding in relation to pregnancy.  Advised I do not know of any direct guideline about this but it would seem reasonable that the first trimester and even up to viability would be ok but this depends on whether she is doing jumping and, of course, if she were thrown from horse.  Pt is going to start PNV or folic acid.  Dosing discussed.    Review of Systems  All other systems reviewed and are negative.      Objective:   Physical Exam  Constitutional: She is oriented to person, place, and time. She appears well-developed and well-nourished.  Genitourinary: Vagina normal and uterus normal. There is no rash, tenderness or lesion on the right labia. There is no rash, tenderness or lesion on the left labia. Uterus is not tender. Cervix exhibits no motion tenderness. Right adnexum displays no mass, no tenderness and no fullness. Left adnexum displays no mass, no tenderness and no fullness.  Lymphadenopathy:       Right: No inguinal adenopathy present.       Left: No inguinal adenopathy present.  Neurological: She is alert and oriented to person, place, and time.  Skin: Skin is warm and dry.  Psychiatric: She has a normal mood and affect.        Assessment:     DUB that seems to have resolved since hysteroscopy, D&C Desired pregnancy for next year     Plan:     Start ovulation predictor kits and let me know if not ovulating or ovulating early or late for planning for pregnancy Pt knows to call if has any future irregular bleeding Start PNV vs folic acid.     About 20 minutes in face to face discussion spent with pt today, in addition to physical exam.

## 2015-06-16 NOTE — L&D Delivery Note (Signed)
Delivery Note  At 2:36 AM a viable female was delivered via Vaginal, Spontaneous Delivery (Presentation:direct OA ;  ).  APGAR: 8, 9; weight  .   Placenta status:intact ,3VC .  Cord:  with the following complications: .  Cord pH: n/a  Anesthesia:  Epidural Episiotomy: None Lacerations: Right Vaginal, bilateral labial Suture Repair: 3.0 vicryl Est. Blood Loss (mL): 100  Mom to postpartum.  Baby to Couplet care / Skin to Skin.  Katrina Parrish 05/02/2016, 3:01 AM

## 2015-09-04 ENCOUNTER — Encounter: Payer: Self-pay | Admitting: Obstetrics & Gynecology

## 2015-09-04 NOTE — Telephone Encounter (Signed)
Called patient and scheduled consult with Leota Sauerseborah Leonard CNM for 09/06/15 at 0800. Patient agreeable and will call back if any concerns prior to appointment.

## 2015-09-06 ENCOUNTER — Encounter: Payer: Self-pay | Admitting: Certified Nurse Midwife

## 2015-09-06 ENCOUNTER — Ambulatory Visit (INDEPENDENT_AMBULATORY_CARE_PROVIDER_SITE_OTHER): Payer: PRIVATE HEALTH INSURANCE | Admitting: Certified Nurse Midwife

## 2015-09-06 VITALS — BP 104/64 | HR 70 | Resp 16 | Ht 65.25 in | Wt 129.0 lb

## 2015-09-06 DIAGNOSIS — N912 Amenorrhea, unspecified: Secondary | ICD-10-CM | POA: Diagnosis not present

## 2015-09-06 DIAGNOSIS — Z3201 Encounter for pregnancy test, result positive: Secondary | ICD-10-CM | POA: Diagnosis not present

## 2015-09-06 LAB — POCT URINE PREGNANCY: Preg Test, Ur: POSITIVE — AB

## 2015-09-06 NOTE — Patient Instructions (Signed)
Prenatal Care °WHAT IS PRENATAL CARE?  °Prenatal care is the process of caring for a pregnant woman before she gives birth. Prenatal care makes sure that she and her baby remain as healthy as possible throughout pregnancy. Prenatal care may be provided by a midwife, family practice health care provider, or a childbirth and pregnancy specialist (obstetrician). Prenatal care may include physical examinations, testing, treatments, and education on nutrition, lifestyle, and social support services. °WHY IS PRENATAL CARE SO IMPORTANT?  °Early and consistent prenatal care increases the chance that you and your baby will remain healthy throughout your pregnancy. This type of care also decreases a baby's risk of being born too early (prematurely), or being born smaller than expected (small for gestational age). Any underlying medical conditions you may have that could pose a risk during your pregnancy are discussed during prenatal care visits. You will also be monitored regularly for any new conditions that may arise during your pregnancy so they can be treated quickly and effectively. °WHAT HAPPENS DURING PRENATAL CARE VISITS? °Prenatal care visits may include the following: °Discussion °Tell your health care provider about any new signs or symptoms you have experienced since your last visit. These might include: °· Nausea or vomiting. °· Increased or decreased level of energy. °· Difficulty sleeping. °· Back or leg pain. °· Weight changes. °· Frequent urination. °· Shortness of breath with physical activity. °· Changes in your skin, such as the development of a rash or itchiness. °· Vaginal discharge or bleeding. °· Feelings of excitement or nervousness. °· Changes in your baby's movements. °You may want to write down any questions or topics you want to discuss with your health care provider and bring them with you to your appointment. °Examination °During your first prenatal care visit, you will likely have a complete  physical exam. Your health care provider will often examine your vagina, cervix, and the position of your uterus, as well as check your heart, lungs, and other body systems. As your pregnancy progresses, your health care provider will measure the size of your uterus and your baby's position inside your uterus. He or she may also examine you for early signs of labor. Your prenatal visits may also include checking your blood pressure and, after about 10-12 weeks of pregnancy, listening to your baby's heartbeat. °Testing °Regular testing often includes: °· Urinalysis. This checks your urine for glucose, protein, or signs of infection. °· Blood count. This checks the levels of white and red blood cells in your body. °· Tests for sexually transmitted infections (STIs). Testing for STIs at the beginning of pregnancy is routinely done and is required in many states. °· Antibody testing. You will be checked to see if you are immune to certain illnesses, such as rubella, that can affect a developing fetus. °· Glucose screen. Around 24-28 weeks of pregnancy, your blood glucose level will be checked for signs of gestational diabetes. Follow-up tests may be recommended. °· Group B strep. This is a bacteria that is commonly found inside a woman's vagina. This test will inform your health care provider if you need an antibiotic to reduce the amount of this bacteria in your body prior to labor and childbirth. °· Ultrasound. Many pregnant women undergo an ultrasound screening around 18-20 weeks of pregnancy to evaluate the health of the fetus and check for any developmental abnormalities. °· HIV (human immunodeficiency virus) testing. Early in your pregnancy, you will be screened for HIV. If you are at high risk for HIV, this test   may be repeated during your third trimester of pregnancy. °You may be offered other testing based on your age, personal or family medical history, or other factors.  °HOW OFTEN SHOULD I PLAN TO SEE MY  HEALTH CARE PROVIDER FOR PRENATAL CARE? °Your prenatal care check-up schedule depends on any medical conditions you have before, or develop during, your pregnancy. If you do not have any underlying medical conditions, you will likely be seen for checkups: °· Monthly, during the first 6 months of pregnancy. °· Twice a month during months 7 and 8 of pregnancy. °· Weekly starting in the 9th month of pregnancy and until delivery. °If you develop signs of early labor or other concerning signs or symptoms, you may need to see your health care provider more often. Ask your health care provider what prenatal care schedule is best for you. °WHAT CAN I DO TO KEEP MYSELF AND MY BABY AS HEALTHY AS POSSIBLE DURING MY PREGNANCY? °· Take a prenatal vitamin containing 400 micrograms (0.4 mg) of folic acid every day. Your health care provider may also ask you to take additional vitamins such as iodine, vitamin D, iron, copper, and zinc. °· Take 1500-2000 mg of calcium daily starting at your 20th week of pregnancy until you deliver your baby. °· Make sure you are up to date on your vaccinations. Unless directed otherwise by your health care provider: °¨ You should receive a tetanus, diphtheria, and pertussis (Tdap) vaccination between the 27th and 36th week of your pregnancy, regardless of when your last Tdap immunization occurred. This helps protect your baby from whooping cough (pertussis) after he or she is born. °¨ You should receive an annual inactivated influenza vaccine (IIV) to help protect you and your baby from influenza. This can be done at any point during your pregnancy. °· Eat a well-rounded diet that includes: °¨ Fresh fruits and vegetables. °¨ Lean proteins. °¨ Calcium-rich foods such as milk, yogurt, hard cheeses, and dark, leafy greens. °¨ Whole grain breads. °· Do not eat seafood high in mercury, including: °¨ Swordfish. °¨ Tilefish. °¨ Shark. °¨ King mackerel. °¨ More than 6 oz tuna per week. °· Do not eat: °¨ Raw  or undercooked meats or eggs. °¨ Unpasteurized foods, such as soft cheeses (brie, blue, or feta), juices, and milks. °¨ Lunch meats. °¨ Hot dogs that have not been heated until they are steaming. °· Drink enough water to keep your urine clear or pale yellow. For many women, this may be 10 or more 8 oz glasses of water each day. Keeping yourself hydrated helps deliver nutrients to your baby and may prevent the start of pre-term uterine contractions. °· Do not use any tobacco products including cigarettes, chewing tobacco, or electronic cigarettes. If you need help quitting, ask your health care provider. °· Do not drink beverages containing alcohol. No safe level of alcohol consumption during pregnancy has been determined. °· Do not use any illegal drugs. These can harm your developing baby or cause a miscarriage. °· Ask your health care provider or pharmacist before taking any prescription or over-the-counter medicines, herbs, or supplements. °· Limit your caffeine intake to no more than 200 mg per day. °· Exercise. Unless told otherwise by your health care provider, try to get 30 minutes of moderate exercise most days of the week. Do not  do high-impact activities, contact sports, or activities with a high risk of falling, such as horseback riding or downhill skiing. °· Get plenty of rest. °· Avoid anything that raises your   body temperature, such as hot tubs and saunas. °· If you own a cat, do not empty its litter box. Bacteria contained in cat feces can cause an infection called toxoplasmosis. This can result in serious harm to the fetus. °· Stay away from chemicals such as insecticides, lead, mercury, and cleaning or paint products that contain solvents. °· Do not have any X-rays taken unless medically necessary. °· Take a childbirth and breastfeeding preparation class. Ask your health care provider if you need a referral or recommendation. °  °This information is not intended to replace advice given to you by  your health care provider. Make sure you discuss any questions you have with your health care provider. °  °Document Released: 06/04/2003 Document Revised: 06/22/2014 Document Reviewed: 08/16/2013 °Elsevier Interactive Patient Education ©2016 [employer] ° °

## 2015-09-06 NOTE — Progress Notes (Addendum)
35 y.o.married female g0p0 presents with amenorrhea with + UPT on 08/29/15. LMP 07/31/15 Planned  pregnancy. Complaining of breast tenderness, fatigue, nausea. Denies spotting, bleeding or cramping. Medications she is taking are: Prenatal vitamins only  . Patient has not consumed alcohol since + UPT. Spouse supportive. 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 [place] going to [place] for practice with riding horses for competition in 10-12 weeks. She has already spoken to trainer who is now aware of pregnancy and will be taking extra precautions. Patient is working on eating small frequent meals to help with nausea, no vomiting. Excited about pregnancy.  O: HPI pertinent to above. Healthy WDWN female Affect: normal, orientation x 3  Last Aex:04/17/15 Pap smear: negative            Rubella screen: immune per patient  A: Amenorrhea with positive UPT  5 wk 2 days [facility] Planned   P: Reviewed with patient importance of prenatal care during pregnancy. Given OB provider list. Reviewed nutrition importance of pregnancy and selecting from all food groups and making sure to have adequate protein intake daily. Discussed avoiding raw or exotic fish, soft cheeses due to risk of bacteria . Discussed concerns with FAS with alcohol use in pregnancy. Discussed increase of IUGR and SIDS with smoking use or second smoke. Reviewed warning signs of early pregnancy and need to advise if occurs. Discussed comfort measures for early pregnancy changes. Offered viability PUS here prior to initiating prenatal care. Patient will advise if plans to have PUS. She will be called with insurance information and scheduled. Discussed risks of horse back riding especially competing with risk of falls or injury which could affect the pregnancy. Patient wears protective gear and feels this is not a problem. [facility] regarding prior to her trip. Also discussed Zika risk and to  check regarding this also. Questions addressed at length.  Labs:none Rv prn  Time spent with patient in face to face counseling regarding pregnancy and prenatal care 33 minutes   Addendum: Patient [facility]. [facility].

## 2015-09-11 NOTE — Progress Notes (Signed)
Encounter reviewed Jill Jertson, MD   

## 2015-09-23 LAB — OB RESULTS CONSOLE ABO/RH
RH Type: POSITIVE
RH Type: POSITIVE

## 2015-09-23 LAB — OB RESULTS CONSOLE RPR
RPR: NONREACTIVE
RPR: NONREACTIVE

## 2015-09-23 LAB — OB RESULTS CONSOLE GC/CHLAMYDIA
Chlamydia: NEGATIVE
GC PROBE AMP, GENITAL: NEGATIVE

## 2015-09-23 LAB — OB RESULTS CONSOLE ANTIBODY SCREEN
Antibody Screen: NEGATIVE
Antibody Screen: NEGATIVE

## 2015-09-23 LAB — OB RESULTS CONSOLE HEPATITIS B SURFACE ANTIGEN
Hepatitis B Surface Ag: NEGATIVE
Hepatitis B Surface Ag: NEGATIVE

## 2015-09-23 LAB — OB RESULTS CONSOLE HIV ANTIBODY (ROUTINE TESTING)
HIV: NONREACTIVE
HIV: NONREACTIVE

## 2015-09-23 LAB — OB RESULTS CONSOLE RUBELLA ANTIBODY, IGM
Rubella: IMMUNE
Rubella: IMMUNE

## 2015-10-03 ENCOUNTER — Encounter: Payer: Self-pay | Admitting: Certified Nurse Midwife

## 2015-10-04 ENCOUNTER — Telehealth: Payer: Self-pay | Admitting: Emergency Medicine

## 2015-10-04 NOTE — Telephone Encounter (Signed)
Call to patient after review of mychart message with Katrina Parrish CNM.  Patient has established care with new OB. Patient is advised per Katrina Parrish CNM to continue to try to reach [facility] for evaluation. Patient states she is trying to not to be extra stressed out and appreciates our phone call and understands that she needs to continue to call their office. She will do so.  Routing to provider for final review. Patient agreeable to disposition. Will close encounter.

## 2015-10-04 NOTE — Telephone Encounter (Signed)
Telephone call for triage created to discuss message with patient and disposition as appropriate.   

## 2015-10-04 NOTE — Telephone Encounter (Signed)
Chief Complaint  Patient presents with  . Advice Only    Patient sent mychart message with request for medical advice.     ===View-only below this line===   ----- Message -----    From: Gerarda GuntherESAI,Awa C    Sent: 10/03/2015  5:32 PM EDT      To: Leota SauersLEONARD,DEBORAH, CNM Subject: Non-Urgent Medical Question  I [facility] but have only been seen for an RN visit.  Dr. Richardson Doppole can see me May 4th.  Last Friday I began having pain on the left side just medial to my hip bone.  thought it might be some  pain from constipation however I addressed that issue.  Friday evening it was severe.  Movement was painful, it was a sharp, stabbing, burning type pain unrelieved by anything.  Saturday is got better and by Sunday evening it was gone.  It returned Wed evening.  It began suddenly but was not as severe.  This morning I was still uncomfortable so I called the OB office, they did not seem concerned.  The pain comes and goes.  At times it is very severe, like being stabbed with a hot poker but it may last only minutes.  It has stayed in the same location, no vaginal discharge or bleeding.  Its worse with a full bladder.  Today it was actually better when standing.  Ive seen Mrs. Darcel BayleyLeonard for half my life so if she says its nothing to worry about I will stop worrying. I wanted her advice.

## 2015-11-12 MED FILL — ONDANSETRON ODT 8 MG TABLET: 8 | 7 days supply | Qty: 21 | Fill #0

## 2016-02-11 ENCOUNTER — Other Ambulatory Visit (HOSPITAL_COMMUNITY): Payer: Self-pay | Admitting: Obstetrics and Gynecology

## 2016-02-11 DIAGNOSIS — O26843 Uterine size-date discrepancy, third trimester: Secondary | ICD-10-CM

## 2016-02-11 DIAGNOSIS — Z3689 Encounter for other specified antenatal screening: Secondary | ICD-10-CM

## 2016-02-11 DIAGNOSIS — Z3A28 28 weeks gestation of pregnancy: Secondary | ICD-10-CM

## 2016-02-12 ENCOUNTER — Ambulatory Visit (HOSPITAL_COMMUNITY)
Admission: RE | Admit: 2016-02-12 | Discharge: 2016-02-12 | Disposition: A | Discharge status: Home / Self Care | Payer: PRIVATE HEALTH INSURANCE | Source: Ambulatory Visit | Attending: Obstetrics and Gynecology | Admitting: Obstetrics and Gynecology

## 2016-02-12 ENCOUNTER — Other Ambulatory Visit (HOSPITAL_COMMUNITY): Payer: Self-pay | Admitting: Obstetrics and Gynecology

## 2016-02-12 DIAGNOSIS — Z3A28 28 weeks gestation of pregnancy: Secondary | ICD-10-CM | POA: Insufficient documentation

## 2016-02-12 DIAGNOSIS — O26843 Uterine size-date discrepancy, third trimester: Secondary | ICD-10-CM

## 2016-02-12 DIAGNOSIS — Z3689 Encounter for other specified antenatal screening: Secondary | ICD-10-CM

## 2016-02-12 DIAGNOSIS — Z36 Encounter for antenatal screening of mother: Secondary | ICD-10-CM | POA: Diagnosis not present

## 2016-04-17 ENCOUNTER — Ambulatory Visit: Payer: PRIVATE HEALTH INSURANCE | Admitting: Certified Nurse Midwife

## 2016-05-01 ENCOUNTER — Inpatient Hospital Stay (HOSPITAL_COMMUNITY): Payer: PRIVATE HEALTH INSURANCE | Admitting: Anesthesiology

## 2016-05-01 ENCOUNTER — Encounter (HOSPITAL_COMMUNITY): Payer: Self-pay | Admitting: *Deleted

## 2016-05-01 ENCOUNTER — Inpatient Hospital Stay (HOSPITAL_COMMUNITY)
Admission: AD | Admit: 2016-05-01 | Discharge: 2016-05-04 | DRG: 775 | Disposition: A | Discharge status: Home / Self Care | Payer: PRIVATE HEALTH INSURANCE | Source: Ambulatory Visit | Attending: Obstetrics and Gynecology | Admitting: Obstetrics and Gynecology

## 2016-05-01 DIAGNOSIS — Z8249 Family history of ischemic heart disease and other diseases of the circulatory system: Secondary | ICD-10-CM | POA: Diagnosis not present

## 2016-05-01 DIAGNOSIS — Z833 Family history of diabetes mellitus: Secondary | ICD-10-CM | POA: Diagnosis not present

## 2016-05-01 DIAGNOSIS — Z3A39 39 weeks gestation of pregnancy: Secondary | ICD-10-CM | POA: Diagnosis not present

## 2016-05-01 DIAGNOSIS — M419 Scoliosis, unspecified: Secondary | ICD-10-CM | POA: Diagnosis present

## 2016-05-01 DIAGNOSIS — O4202 Full-term premature rupture of membranes, onset of labor within 24 hours of rupture: Secondary | ICD-10-CM | POA: Diagnosis present

## 2016-05-01 DIAGNOSIS — O4290 Premature rupture of membranes, unspecified as to length of time between rupture and onset of labor, unspecified weeks of gestation: Secondary | ICD-10-CM | POA: Diagnosis present

## 2016-05-01 DIAGNOSIS — Z3403 Encounter for supervision of normal first pregnancy, third trimester: Secondary | ICD-10-CM | POA: Diagnosis present

## 2016-05-01 LAB — POCT FERN TEST: POCT FERN TEST: POSITIVE

## 2016-05-01 LAB — CBC
HCT: 34.2 % — ABNORMAL LOW (ref 36.0–46.0)
HEMOGLOBIN: 11.8 g/dL — AB (ref 12.0–15.0)
MCH: 29.9 pg (ref 26.0–34.0)
MCHC: 34.5 g/dL (ref 30.0–36.0)
MCV: 86.8 fL (ref 78.0–100.0)
Platelets: 230 10*3/uL (ref 150–400)
RBC: 3.94 MIL/uL (ref 3.87–5.11)
RDW: 12.8 % (ref 11.5–15.5)
WBC: 11.2 10*3/uL — ABNORMAL HIGH (ref 4.0–10.5)

## 2016-05-01 LAB — TYPE AND SCREEN
ABO/RH(D): O POS
ANTIBODY SCREEN: NEGATIVE

## 2016-05-01 LAB — OB RESULTS CONSOLE GBS: STREP GROUP B AG: NEGATIVE

## 2016-05-01 MED ORDER — OXYTOCIN 40 UNITS IN LACTATED RINGERS INFUSION - SIMPLE MED
1.0000 m[IU]/min | INTRAVENOUS | Status: DC
  Administered 2016-05-01: 1 m[IU]/min via INTRAVENOUS
  Filled 2016-05-01: qty 1000

## 2016-05-01 MED ORDER — LACTATED RINGERS IV SOLN: 500.0000 mL | INTRAVENOUS | Status: DC | PRN

## 2016-05-01 MED ORDER — LIDOCAINE HCL (PF) 1 % IJ SOLN
30.0000 mL | INTRAMUSCULAR | Status: DC | PRN
  Filled 2016-05-01: qty 30

## 2016-05-01 MED ORDER — SOD CITRATE-CITRIC ACID 500-334 MG/5ML PO SOLN: 30.0000 mL | ORAL | Status: DC | PRN

## 2016-05-01 MED ORDER — TERBUTALINE SULFATE 1 MG/ML IJ SOLN
0.2500 mg | Freq: Once | INTRAMUSCULAR | Status: DC | PRN
  Filled 2016-05-01: qty 1

## 2016-05-01 MED ORDER — OXYTOCIN 40 UNITS IN LACTATED RINGERS INFUSION - SIMPLE MED: 2.5000 [IU]/h | INTRAVENOUS | Status: DC

## 2016-05-01 MED ORDER — DIPHENHYDRAMINE HCL 50 MG/ML IJ SOLN: 12.5000 mg | INTRAMUSCULAR | Status: DC | PRN

## 2016-05-01 MED ORDER — FENTANYL 2.5 MCG/ML BUPIVACAINE 1/10 % EPIDURAL INFUSION (WH - ANES)
14.0000 mL/h | INTRAMUSCULAR | Status: DC | PRN
  Administered 2016-05-01: 12 mL/h via EPIDURAL
  Filled 2016-05-01: qty 100

## 2016-05-01 MED ORDER — ONDANSETRON HCL 4 MG/2ML IJ SOLN
4.0000 mg | Freq: Four times a day (QID) | INTRAMUSCULAR | Status: DC | PRN
  Administered 2016-05-02: 4 mg via INTRAVENOUS
  Filled 2016-05-01: qty 2

## 2016-05-01 MED ORDER — LIDOCAINE-EPINEPHRINE (PF) 2 %-1:200000 IJ SOLN
INTRAMUSCULAR | Status: DC | PRN
  Administered 2016-05-01: 4 mL

## 2016-05-01 MED ORDER — EPHEDRINE 5 MG/ML INJ
10.0000 mg | INTRAVENOUS | Status: DC | PRN
Start: 2016-05-01 — End: 2016-05-02
  Filled 2016-05-01: qty 4

## 2016-05-01 MED ORDER — OXYTOCIN BOLUS FROM INFUSION: 500.0000 mL | Freq: Once | INTRAVENOUS | Status: DC

## 2016-05-01 MED ORDER — LACTATED RINGERS IV SOLN: 500.0000 mL | Freq: Once | INTRAVENOUS | Status: DC

## 2016-05-01 MED ORDER — FENTANYL CITRATE (PF) 100 MCG/2ML IJ SOLN: 50.0000 ug | INTRAMUSCULAR | Status: DC | PRN

## 2016-05-01 MED ORDER — EPHEDRINE 5 MG/ML INJ
10.0000 mg | INTRAVENOUS | Status: DC | PRN
  Filled 2016-05-01: qty 4

## 2016-05-01 MED ORDER — OXYCODONE-ACETAMINOPHEN 5-325 MG PO TABS: 1.0000 | ORAL_TABLET | ORAL | Status: DC | PRN

## 2016-05-01 MED ORDER — BUPIVACAINE HCL (PF) 0.25 % IJ SOLN
INTRAMUSCULAR | Status: DC | PRN
  Administered 2016-05-01 (×2): 4 mL via EPIDURAL

## 2016-05-01 MED ORDER — PHENYLEPHRINE 40 MCG/ML (10ML) SYRINGE FOR IV PUSH (FOR BLOOD PRESSURE SUPPORT)
80.0000 ug | PREFILLED_SYRINGE | INTRAVENOUS | Status: DC | PRN
  Filled 2016-05-01: qty 5

## 2016-05-01 MED ORDER — OXYCODONE-ACETAMINOPHEN 5-325 MG PO TABS: 2.0000 | ORAL_TABLET | ORAL | Status: DC | PRN

## 2016-05-01 MED ORDER — LACTATED RINGERS IV SOLN
INTRAVENOUS | Status: DC
  Administered 2016-05-01: 20:00:00 via INTRAVENOUS

## 2016-05-01 MED ORDER — ACETAMINOPHEN 325 MG PO TABS: 650.0000 mg | ORAL_TABLET | ORAL | Status: DC | PRN

## 2016-05-01 MED ORDER — PHENYLEPHRINE 40 MCG/ML (10ML) SYRINGE FOR IV PUSH (FOR BLOOD PRESSURE SUPPORT)
80.0000 ug | PREFILLED_SYRINGE | INTRAVENOUS | Status: DC | PRN
Start: 2016-05-01 — End: 2016-05-02
  Filled 2016-05-01: qty 10
  Filled 2016-05-01: qty 5

## 2016-05-01 NOTE — MAU Note (Signed)
Pt states she thinks her water broke at 1730 and her contractions started at 1800.  Pt states she is feeling the baby move.  Pt states she had some bloody tinge in some of the fluid during the first gush but nothing since.

## 2016-05-01 NOTE — MAU Note (Signed)
Water broke about  An hour ago, ctx's started 30 min later.  Was 2-3 when last checked.

## 2016-05-01 NOTE — Anesthesia Procedure Notes (Signed)
Epidural Patient location during procedure: OB  Staffing Anesthesiologist: Tynlee Bayle Performed: anesthesiologist   Preanesthetic Checklist Completed: patient identified, surgical consent, pre-op evaluation, timeout performed, IV checked, risks and benefits discussed and monitors and equipment checked  Epidural Patient position: sitting Prep: DuraPrep Patient monitoring: heart rate, cardiac monitor, continuous pulse ox and blood pressure Approach: midline Location: L3-L4 Injection technique: LOR saline  Needle:  Needle type: Tuohy  Needle gauge: 17 G Needle length: 9 cm Needle insertion depth: 7 cm Catheter type: closed end flexible Catheter size: 19 Gauge Catheter at skin depth: 13 cm Test dose: negative and 2% lidocaine with Epi 1:200 K  Assessment Events: blood not aspirated, injection not painful, no injection resistance, negative IV test and no paresthesia  Additional Notes Reason for block:procedure for pain     

## 2016-05-01 NOTE — Anesthesia Preprocedure Evaluation (Signed)
Anesthesia Evaluation  Patient identified by MRN, date of birth, ID band Patient awake    Reviewed: Allergy & Precautions, Patient's Chart, lab work & pertinent test results  History of Anesthesia Complications Negative for: history of anesthetic complications  Airway Mallampati: II  TM Distance: >3 FB Neck ROM: Full    Dental  (+) Teeth Intact   Pulmonary neg pulmonary ROS,    breath sounds clear to auscultation       Cardiovascular negative cardio ROS   Rhythm:Regular     Neuro/Psych  Headaches, negative psych ROS   GI/Hepatic negative GI ROS, Neg liver ROS,   Endo/Other  negative endocrine ROS  Renal/GU negative Renal ROS     Musculoskeletal   Abdominal   Peds  Hematology negative hematology ROS (+)   Anesthesia Other Findings   Reproductive/Obstetrics (+) Pregnancy                             Anesthesia Physical Anesthesia Plan  ASA: II  Anesthesia Plan: Epidural   Post-op Pain Management:    Induction:   Airway Management Planned:   Additional Equipment:   Intra-op Plan:   Post-operative Plan:   Informed Consent: I have reviewed the patients History and Physical, chart, labs and discussed the procedure including the risks, benefits and alternatives for the proposed anesthesia with the patient or authorized representative who has indicated his/her understanding and acceptance.     Plan Discussed with: Anesthesiologist  Anesthesia Plan Comments:         Anesthesia Quick Evaluation
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
## 2016-05-02 LAB — RPR: RPR Ser Ql: NONREACTIVE

## 2016-05-02 LAB — CBC
HCT: 29.4 % — ABNORMAL LOW (ref 36.0–46.0)
HEMOGLOBIN: 10.3 g/dL — AB (ref 12.0–15.0)
MCH: 30.3 pg (ref 26.0–34.0)
MCHC: 35 g/dL (ref 30.0–36.0)
MCV: 86.5 fL (ref 78.0–100.0)
PLATELETS: 172 10*3/uL (ref 150–400)
RBC: 3.4 MIL/uL — AB (ref 3.87–5.11)
RDW: 12.8 % (ref 11.5–15.5)
WBC: 16.9 10*3/uL — ABNORMAL HIGH (ref 4.0–10.5)

## 2016-05-02 LAB — ABO/RH: ABO/RH(D): O POS

## 2016-05-02 MED ORDER — ONDANSETRON HCL 4 MG PO TABS: 4.0000 mg | ORAL_TABLET | ORAL | Status: DC | PRN

## 2016-05-02 MED ORDER — SIMETHICONE 80 MG PO CHEW: 80.0000 mg | CHEWABLE_TABLET | ORAL | Status: DC | PRN

## 2016-05-02 MED ORDER — OXYTOCIN 40 UNITS IN LACTATED RINGERS INFUSION - SIMPLE MED: 2.5000 [IU]/h | INTRAVENOUS | Status: DC | PRN

## 2016-05-02 MED ORDER — ACETAMINOPHEN 325 MG PO TABS
650.0000 mg | ORAL_TABLET | ORAL | Status: DC | PRN
  Administered 2016-05-02 – 2016-05-04 (×3): 650 mg via ORAL
  Filled 2016-05-02 (×3): qty 2

## 2016-05-02 MED ORDER — OXYCODONE-ACETAMINOPHEN 5-325 MG PO TABS: 2.0000 | ORAL_TABLET | ORAL | Status: DC | PRN

## 2016-05-02 MED ORDER — DIBUCAINE 1 % RE OINT: 1.0000 "application " | TOPICAL_OINTMENT | RECTAL | Status: DC | PRN

## 2016-05-02 MED ORDER — PRENATAL MULTIVITAMIN CH
1.0000 | ORAL_TABLET | Freq: Every day | ORAL | Status: DC
  Administered 2016-05-02 – 2016-05-04 (×3): 1 via ORAL
  Filled 2016-05-02 (×3): qty 1

## 2016-05-02 MED ORDER — COCONUT OIL OIL
1.0000 "application " | TOPICAL_OIL | Status: DC | PRN
  Administered 2016-05-03: 1 via TOPICAL
  Filled 2016-05-02: qty 120

## 2016-05-02 MED ORDER — SENNOSIDES-DOCUSATE SODIUM 8.6-50 MG PO TABS
2.0000 | ORAL_TABLET | ORAL | Status: DC
  Administered 2016-05-02: 2 via ORAL
  Filled 2016-05-02 (×2): qty 2

## 2016-05-02 MED ORDER — ONDANSETRON HCL 4 MG/2ML IJ SOLN: 4.0000 mg | INTRAMUSCULAR | Status: DC | PRN

## 2016-05-02 MED ORDER — MAGNESIUM HYDROXIDE 400 MG/5ML PO SUSP: 30.0000 mL | ORAL | Status: DC | PRN

## 2016-05-02 MED ORDER — METHYLERGONOVINE MALEATE 0.2 MG/ML IJ SOLN: 0.2000 mg | INTRAMUSCULAR | Status: DC | PRN

## 2016-05-02 MED ORDER — BENZOCAINE-MENTHOL 20-0.5 % EX AERO
1.0000 "application " | INHALATION_SPRAY | CUTANEOUS | Status: DC | PRN
  Administered 2016-05-02: 1 via TOPICAL
  Filled 2016-05-02: qty 56

## 2016-05-02 MED ORDER — IBUPROFEN 600 MG PO TABS
600.0000 mg | ORAL_TABLET | Freq: Four times a day (QID) | ORAL | Status: DC
  Administered 2016-05-02 – 2016-05-04 (×10): 600 mg via ORAL
  Filled 2016-05-02 (×10): qty 1

## 2016-05-02 MED ORDER — WITCH HAZEL-GLYCERIN EX PADS
1.0000 "application " | MEDICATED_PAD | CUTANEOUS | Status: DC | PRN
  Administered 2016-05-02: 1 via TOPICAL

## 2016-05-02 MED ORDER — DIPHENHYDRAMINE HCL 25 MG PO CAPS: 25.0000 mg | ORAL_CAPSULE | Freq: Four times a day (QID) | ORAL | Status: DC | PRN

## 2016-05-02 MED ORDER — METHYLERGONOVINE MALEATE 0.2 MG PO TABS: 0.2000 mg | ORAL_TABLET | ORAL | Status: DC | PRN

## 2016-05-02 MED ORDER — OXYCODONE-ACETAMINOPHEN 5-325 MG PO TABS: 1.0000 | ORAL_TABLET | ORAL | Status: DC | PRN

## 2016-05-02 MED ORDER — ZOLPIDEM TARTRATE 5 MG PO TABS: 5.0000 mg | ORAL_TABLET | Freq: Every evening | ORAL | Status: DC | PRN

## 2016-05-02 MED ORDER — TETANUS-DIPHTH-ACELL PERTUSSIS 5-2.5-18.5 LF-MCG/0.5 IM SUSP: 0.5000 mL | Freq: Once | INTRAMUSCULAR | Status: DC

## 2016-05-02 NOTE — Progress Notes (Signed)
Post Partum Day 0 Subjective: no complaints  Objective: Blood pressure (!) 111/53, pulse 89, temperature 98.4 F (36.9 C), temperature source Oral, resp. rate 18, height 5\' 5"  (1.651 m), weight 65.8 kg (145 lb), last menstrual period 07/31/2015, SpO2 99 %.  Physical Exam:  General: alert, cooperative and no distress Lochia: appropriate Uterine Fundus: firm Incision: n/a DVT Evaluation: No evidence of DVT seen on physical exam.   Recent Labs  05/01/16 1919 05/02/16 0743  HGB 11.8* 10.3*  HCT 34.2* 29.4*    Assessment/Plan: S/p SVD Day #0 Doing well. Breast feeding.  Lactation support. Routine postpartum care.   LOS: 1 day   Katrina Parrish 05/02/2016, 10:47 AM

## 2016-05-02 NOTE — Anesthesia Postprocedure Evaluation (Signed)
Anesthesia Post Note  Patient: Katrina Parrish  Procedure(s) Performed: * No procedures listed *  Patient location during evaluation: Mother Baby Anesthesia Type: Epidural Level of consciousness: awake and alert, oriented and patient cooperative Pain management: pain level controlled Vital Signs Assessment: post-procedure vital signs reviewed and stable Respiratory status: spontaneous breathing Cardiovascular status: stable Postop Assessment: no headache, epidural receding, patient able to bend at knees and no signs of nausea or vomiting Anesthetic complications: no Comments: Reports pain score 1-2.     Last Vitals:  Vitals:   05/02/16 0345 05/02/16 0428  BP: 116/65 135/60  Pulse: 77 63  Resp: 17 16  Temp:  37.2 C    Last Pain:  Vitals:   05/02/16 0428  TempSrc: Oral  PainSc:    Pain Goal: Patients Stated Pain Goal: 0 (05/01/16 1955)               Merrilyn PumaWRINKLE,Christine Morton

## 2016-05-03 NOTE — Progress Notes (Addendum)
Postpartum day #1, NSVD  Subjective Pt without complaints.  Lochia normal.  Pain controlled with Motrin only.  Breast feeding yes.  Using a breast shield.  Temp:  [98.1 F (36.7 C)-98.4 F (36.9 C)] 98.1 F (36.7 C) (11/19 0609) Pulse Rate:  [60-61] 61 (11/19 0609) Resp:  [18] 18 (11/19 0609) BP: (98-123)/(52-75) 98/52 (11/19 0609)  Gen:  NAD, A&O x 3 Uterine fundus:  Firm, nontender Lochia normal Ext:  Mild Edema, no calf tenderness bilaterally  CBC    Component Value Date/Time   WBC 16.9 (H) 05/02/2016 0743   RBC 3.40 (L) 05/02/2016 0743   HGB 10.3 (L) 05/02/2016 0743   HCT 29.4 (L) 05/02/2016 0743   PLT 172 05/02/2016 0743   MCV 86.5 05/02/2016 0743   MCH 30.3 05/02/2016 0743   MCHC 35.0 05/02/2016 0743   RDW 12.8 05/02/2016 0743   LYMPHSABS 1.8 07/17/2008 0955   MONOABS 0.4 07/17/2008 0955   EOSABS 0.1 07/17/2008 0955   BASOSABS 0.0 07/17/2008 0955     A/P: S/p SVD doing well. Routine postpartum care. Lactation support. Discharge in am. Circumcision in am.  Katrina Parrish 05/03/2016, 4:21 PM

## 2016-05-03 NOTE — Lactation Note (Signed)
This note was copied from a baby's chart. Lactation Consultation Note; Mom reports baby has been nursing well on the right breast but she is having trouble getting him to latch to left. Has been using NS and he just finished 30 min feeding on that breast. Mom states she feels comfortable with using NS. And he is doing better today. Encouraged to continue trying without NS at some feedings. Plans to get pump from insurance company. Encouraged to call them and ask about how to get one. No further questions at present. Reviewed cluster feeding the second night and encouraged to rest this afternoon.Bf brochure given . Reviewed our phone number, OP appointments and BFSG as resources for support after DC. To call for assist prn Patient Name: Katrina Celedonio MiyamotoSarah Bedwell ZOXWR'UToday's Date: 05/03/2016 Reason for consult: Initial assessment   Maternal Data Formula Feeding for Exclusion: No Has patient been taught Hand Expression?: Yes Does the patient have breastfeeding experience prior to this delivery?: No  Feeding Feeding Type: Breast Fed Length of feed: 30 min  LATCH Score/Interventions Latch:  (asked mother to call w next feed for latch score)                    Lactation Tools Discussed/Used Tools: Nipple Shields;Pump;Shells Shell Type: Inverted Breast pump type: Manual   Consult Status Consult Status: Follow-up Date: 05/04/16 Follow-up type: Out-patient    Pamelia HoitWeeks, Andric Kerce D 05/03/2016, 4:20 PM

## 2016-05-04 ENCOUNTER — Encounter (HOSPITAL_COMMUNITY): Payer: Self-pay | Admitting: *Deleted

## 2016-05-04 MED ORDER — IBUPROFEN 600 MG PO TABS: 600.0000 mg | ORAL_TABLET | Freq: Four times a day (QID) | ORAL | 1 refills | Status: DC | PRN

## 2016-05-04 NOTE — Lactation Note (Signed)
This note was copied from a baby's chart. Lactation Consultation Note RN notified LC that mom has raw area to nipple. Baby has hyperbilirubinemia, had blood serum drawn. Notes says mom has flat nipples. Nipples small erect w/raw area to Rt. Nipple. Mom holding baby swaddled on boppy. Asked mom how she is feeding, mom stated in cradle position. Discussed position options. Encouraged STS, cheeks to breast, chin tug if needed. Comfort gels given. Mom's round breast may be starting to fill. encouraged occasional massage during feeding, and pump if needed to soften breast. Call for assistance to obtain deep latch.  Patient Name: Katrina Celedonio MiyamotoSarah Parrish WUJWJ'XToday's Date: 05/04/2016 Reason for consult: Follow-up assessment;Hyperbilirubinemia;Breast/nipple pain   Maternal Data    Feeding    LATCH Score/Interventions                      Lactation Tools Discussed/Used Tools: Comfort gels   Consult Status      Juquan Reznick G 05/04/2016, 1:07 AM

## 2016-05-04 NOTE — Lactation Note (Signed)
This note was copied from a baby's chart. Lactation Consultation Note  Parents state baby's latch in improving.  Using #16NS on L side only.  Provided mother w/ extra nipple shields. Suggest post pumping 2-3 times a day and giving volume back to help stabilize wt loss. Mother states noted breastmilk in NS after feeding. Offered OP appt.  Mother states they will call if needed. Suggest they call for LC to view latch if desired. Reviewed engorgement care and monitoring voids/stools. Mom encouraged to feed baby 8-12 times/24 hours and with feeding cues.  Try at least once a day to latch baby without NS half way through feeding.  Patient Name: Boy Celedonio MiyamotoSarah Naclerio ZOXWR'UToday's Date: 05/04/2016     Maternal Data    Feeding    LATCH Score/Interventions                      Lactation Tools Discussed/Used     Consult Status      Dahlia ByesBerkelhammer, Ruth Boschen 05/04/2016, 11:18 AM

## 2016-05-04 NOTE — Discharge Summary (Signed)
OB Discharge Summary     Patient Name: Katrina Parrish DOB: 05/04/1981 MRN: [id number]  Date of admission: 05/01/2016 Delivering MD: Geryl RankinsVARNADO, EVELYN   Date of discharge: 05/04/2016  Admitting diagnosis: 39w labor, ctx 5-8 min, water broke Intrauterine pregnancy: 131w6d     Secondary diagnosis:  Active Problems:   Ruptured, membranes, premature  Additional problems: Scoliosis      Discharge diagnosis: Term Pregnancy Delivered                                                                                                Post partum procedures:None  Augmentation: NA  Complications: None  Hospital course:  Onset of Labor With Vaginal Delivery     35 y.o. yo G1P0000 at 661w6d was admitted in Active Labor on 05/01/2016. Patient had an uncomplicated labor course as follows:  Membrane Rupture Time/Date: 5:30 PM ,05/01/2016   Intrapartum Procedures: Episiotomy: None [1]                                         Lacerations:  Vaginal [6]  Patient had a delivery of a Viable infant. 05/02/2016  Information for the patient's newborn:  Katrina Parrish, Boy Katrina Parrish [[id number]][[id number]]  Delivery Method: Vaginal, Spontaneous Delivery (Filed from Delivery Summary)    Pateint had an uncomplicated postpartum course.  She is ambulating, tolerating a regular diet, passing flatus, and urinating well. Patient is discharged home in stable condition on 05/04/16.    Physical exam Vitals:   05/02/16 1745 05/03/16 0609 05/03/16 1854 05/04/16 0547  BP: 123/75 (!) 98/52 128/79 (!) 106/56  Pulse: 60 61 80 74  Resp: 18 18 18 20   Temp: 98.4 F (36.9 C) 98.1 F (36.7 C) 98 F (36.7 C) 98 F (36.7 C)  TempSrc: Oral Oral Oral Oral  SpO2:    98%  Weight:      Height:       General: alert, cooperative and no distress Lochia: appropriate Uterine Fundus: firm Incision: N/A DVT Evaluation: No evidence of DVT seen on physical exam. Labs: Lab Results  Component Value Date   WBC 16.9 (H) 05/02/2016   HGB 10.3 (L)  05/02/2016   HCT 29.4 (L) 05/02/2016   MCV 86.5 05/02/2016   PLT 172 05/02/2016   CMP Latest Ref Rng & Units 07/17/2008  Glucose 70 - 99 mg/dL 89  BUN 6 - 23 mg/dL 16  Creatinine 0.4 - 1.2 mg/dL 3.870.74  Sodium 564135 - 332145 mEq/L 135  Potassium 3.5 - 5.1 mEq/L 3.5  Chloride 96 - 112 mEq/L 109  CO2 19 - 32 mEq/L 22  Calcium 8.4 - 10.5 mg/dL 9.2    Discharge instruction: per After Visit Summary and "Baby and Me Booklet".  After visit meds:    Medication List    TAKE these medications   acetaminophen 500 MG tablet Commonly known as:  TYLENOL Take 1,000 mg by mouth every 6 (six) hours as needed for mild pain, moderate pain or headache.   ibuprofen 600 MG  tablet Commonly known as:  ADVIL,MOTRIN Take 1 tablet (600 mg total) by mouth every 6 (six) hours as needed.   PRENATAL GUMMIES/DHA & FA 0.4-32.5 MG Chew Chew 2 each by mouth daily.       Diet: routine diet  Activity: Advance as tolerated. Pelvic rest for 6 weeks.   Outpatient follow up:6 weeks Follow up Appt:Future Appointments Date Time Provider Department Center  05/12/2016 6:30 AM [facility] None   Follow up Visit:No Follow-up on file.  Postpartum contraception: Not Discussed  Newborn Data: Live born female  Birth Weight: 6 lb 12.6 oz (3080 g) APGAR: 8, 9  Baby Feeding: Breast Disposition:home with mother   05/04/2016 Katrina Parrish,Katrina Vogelsang J., MD

## 2016-05-04 NOTE — Lactation Note (Signed)
This note was copied from a baby's chart. Lactation Consultation Note  Reviewed hand expression.  Mother has been unable to latch baby on L side without NS. Demonstrated how to compress breast to latch baby on left with teachback. Mother latched baby on L side.  Intermittent sucks and swallows observed. Mother switched to R side independently. Set up DEBP and provided 2 week rental.  Recommend mother post pump for 10-20 min 3-4 times a day and give volume back to baby. Demonstrated how to use spoon, foley cup, syringe or bottle if desired.  Discussed jaundice feeding behavior. Mom encouraged to feed baby 8-12 times/24 hours and with feeding cues.  Reviewed engorgement care and monitoring voids/stools.   Patient Name: Katrina Celedonio MiyamotoSarah Zook JXBJY'NToday's Date: 05/04/2016 Reason for consult: Follow-up assessment   Maternal Data    Feeding Feeding Type: Breast Fed Length of feed: 60 min  LATCH Score/Interventions Latch: Grasps breast easily, tongue down, lips flanged, rhythmical sucking.  Audible Swallowing: Spontaneous and intermittent Intervention(s): Skin to skin;Hand expression  Type of Nipple: Flat  Comfort (Breast/Nipple): Filling, red/small blisters or bruises, mild/mod discomfort  Problem noted: Mild/Moderate discomfort  Hold (Positioning): Assistance needed to correctly position infant at breast and maintain latch.  LATCH Score: 7  Lactation Tools Discussed/Used Nipple shield size: 16   Consult Status Consult Status: Complete    Hardie PulleyBerkelhammer, Reagan Behlke Boschen 05/04/2016, 2:49 PM

## 2016-05-06 ENCOUNTER — Telehealth (HOSPITAL_COMMUNITY): Payer: Self-pay | Admitting: *Deleted

## 2016-05-06 ENCOUNTER — Encounter (HOSPITAL_COMMUNITY): Payer: Self-pay | Admitting: *Deleted

## 2016-05-06 NOTE — Telephone Encounter (Signed)
Preadmission screen  

## 2016-05-11 ENCOUNTER — Telehealth (HOSPITAL_COMMUNITY): Payer: Self-pay | Admitting: *Deleted

## 2016-05-11 NOTE — Telephone Encounter (Signed)
Preadmission screen  

## 2016-05-12 ENCOUNTER — Inpatient Hospital Stay (HOSPITAL_COMMUNITY): Admission: RE | Admit: 2016-05-12 | Payer: PRIVATE HEALTH INSURANCE | Source: Ambulatory Visit

## 2016-06-06 ENCOUNTER — Inpatient Hospital Stay (HOSPITAL_COMMUNITY)
Admission: AD | Admit: 2016-06-06 | Discharge: 2016-06-06 | Disposition: A | Discharge status: Home / Self Care | Payer: PRIVATE HEALTH INSURANCE | Source: Ambulatory Visit | Attending: Obstetrics and Gynecology | Admitting: Obstetrics and Gynecology

## 2016-06-06 ENCOUNTER — Encounter (HOSPITAL_COMMUNITY): Payer: Self-pay

## 2016-06-06 DIAGNOSIS — Z803 Family history of malignant neoplasm of breast: Secondary | ICD-10-CM | POA: Insufficient documentation

## 2016-06-06 DIAGNOSIS — N61 Mastitis without abscess: Secondary | ICD-10-CM

## 2016-06-06 DIAGNOSIS — J45909 Unspecified asthma, uncomplicated: Secondary | ICD-10-CM | POA: Insufficient documentation

## 2016-06-06 DIAGNOSIS — N6320 Unspecified lump in the left breast, unspecified quadrant: Secondary | ICD-10-CM | POA: Insufficient documentation

## 2016-06-06 DIAGNOSIS — N644 Mastodynia: Secondary | ICD-10-CM | POA: Insufficient documentation

## 2016-06-06 MED ORDER — CEPHALEXIN 500 MG PO CAPS: 500.0000 mg | ORAL_CAPSULE | Freq: Four times a day (QID) | ORAL | 2 refills | Status: DC

## 2016-06-06 NOTE — Discharge Instructions (Signed)
Breastfeeding and Mastitis °Mastitis is inflammation of the breast tissue. It can occur in women who are breastfeeding. This can make breastfeeding painful. Mastitis will sometimes go away on its own. Your health care provider will help determine if treatment is needed. °CAUSES °Mastitis is often associated with a blocked milk (lactiferous) duct. This can happen when too much milk builds up in the breast. Causes of excess milk in the breast can include: °· Poor latch-on. If your baby is not latched onto the breast properly, she or he may not empty your breast completely while breastfeeding. °· Allowing too much time to pass between feedings. °· Wearing a bra or other clothing that is too tight. This puts extra pressure on the lactiferous ducts so milk does not flow through them as it should. °Mastitis can also be caused by a bacterial infection. Bacteria may enter the breast tissue through cuts or openings in the skin. In women who are breastfeeding, this may occur because of cracked or irritated skin. Cracks in the skin are often caused when your baby does not latch on properly to the breast. °SIGNS AND SYMPTOMS °· Swelling, redness, tenderness, and pain in an area of the breast. °· Swelling of the glands under the arm on the same side. °· Fever may or may not accompany mastitis. °If an infection is allowed to progress, a collection of pus (abscess) may develop. °DIAGNOSIS  °Your health care provider can usually diagnose mastitis based on your symptoms and a physical exam. Tests may be done to help confirm the diagnosis. These may include: °· Removal of pus from the breast by applying pressure to the area. This pus can be examined in the lab to determine which bacteria are present. If an abscess has developed, the fluid in the abscess can be removed with a needle. This can also be used to confirm the diagnosis and determine the bacteria present. In most cases, pus will not be present. °· Blood tests to determine if  your body is fighting a bacterial infection. °· Mammogram or ultrasound tests to rule out other problems or diseases. °TREATMENT  °Mastitis that occurs with breastfeeding will sometimes go away on its own. Your health care provider may choose to wait 24 hours after first seeing you to decide whether a prescription medicine is needed. If your symptoms are worse after 24 hours, your health care provider will likely prescribe an antibiotic medicine to treat the mastitis. He or she will determine which bacteria are most likely causing the infection and will then select an appropriate antibiotic medicine. This is sometimes changed based on the results of tests performed to identify the bacteria, or if there is no response to the antibiotic medicine selected. Antibiotic medicines are usually given by mouth. You may also be given medicine for pain. °HOME CARE INSTRUCTIONS °· Only take over-the-counter or prescription medicines for pain, fever, or discomfort as directed by your health care provider. °· If your health care provider prescribed an antibiotic medicine, take the medicine as directed. Make sure you finish it even if you start to feel better. °· Do not wear a tight or underwire bra. Wear a soft, supportive bra. °· Increase your fluid intake, especially if you have a fever. °· Continue to empty the breast. Your health care provider can tell you whether this milk is safe for your infant or needs to be thrown out. You may be told to stop nursing until your health care provider thinks it is safe for your baby.   Use a breast pump if you are advised to stop nursing. °· Keep your nipples clean and dry. °· Empty the first breast completely before going to the other breast. If your baby is not emptying your breasts completely for some reason, use a breast pump to empty your breasts. °· If you go back to work, pump your breasts while at work to stay in time with your nursing schedule. °· Avoid allowing your breasts to  become overly filled with milk (engorged). °SEEK MEDICAL CARE IF: °· You have pus-like discharge from the breast. °· Your symptoms do not improve with the treatment prescribed by your health care provider within 2 days. °SEEK IMMEDIATE MEDICAL CARE IF: °· Your pain and swelling are getting worse. °· You have pain that is not controlled with medicine. °· You have a red line extending from the breast toward your armpit. °· You have a fever or persistent symptoms for more than 2-3 days. °· You have a fever and your symptoms suddenly get worse. °MAKE SURE YOU:  °· Understand these instructions. °· Will watch your condition. °· Will get help right away if you are not doing well or get worse. °This information is not intended to replace advice given to you by your health care provider. Make sure you discuss any questions you have with your health care provider. °Document Released: 09/26/2004 Document Revised: 06/06/2013 Document Reviewed: 01/05/2013 °Elsevier Interactive Patient Education © 2017 [employer] ° ° °

## 2016-06-06 NOTE — MAU Note (Signed)
First noticed lump on left breast on Monday. Has tried heat, pumping, feeding, and lump has gotten bigger and more painful. Called lactation and they suggested soaking in epsom salt and different feeding methods. Has not improved and was getting a sharp and shooting pain in the breast.
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
## 2016-06-09 MED FILL — AMOX-CLAV 875-125 MG TABLET: 875-125 | 10 days supply | Qty: 20 | Fill #0

## 2016-06-11 ENCOUNTER — Other Ambulatory Visit: Payer: Self-pay | Admitting: Obstetrics and Gynecology

## 2016-06-11 DIAGNOSIS — N644 Mastodynia: Secondary | ICD-10-CM

## 2016-06-14 ENCOUNTER — Emergency Department (HOSPITAL_COMMUNITY)
Admission: EM | Admit: 2016-06-14 | Discharge: 2016-06-14 | Disposition: A | Discharge status: Home / Self Care | Payer: PRIVATE HEALTH INSURANCE | Attending: Emergency Medicine | Admitting: Emergency Medicine

## 2016-06-14 ENCOUNTER — Encounter (HOSPITAL_COMMUNITY): Payer: Self-pay | Admitting: Emergency Medicine

## 2016-06-14 DIAGNOSIS — Z79899 Other long term (current) drug therapy: Secondary | ICD-10-CM | POA: Diagnosis not present

## 2016-06-14 DIAGNOSIS — N6320 Unspecified lump in the left breast, unspecified quadrant: Secondary | ICD-10-CM | POA: Diagnosis present

## 2016-06-14 DIAGNOSIS — N611 Abscess of the breast and nipple: Secondary | ICD-10-CM

## 2016-06-14 DIAGNOSIS — O9112 Abscess of breast associated with the puerperium: Secondary | ICD-10-CM | POA: Insufficient documentation

## 2016-06-14 DIAGNOSIS — J45909 Unspecified asthma, uncomplicated: Secondary | ICD-10-CM | POA: Diagnosis not present

## 2016-06-14 MED ORDER — LIDOCAINE HCL (PF) 1 % IJ SOLN
INTRAMUSCULAR | Status: AC
  Filled 2016-06-14: qty 30
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
## 2016-06-14 NOTE — Discharge Instructions (Signed)
Read the information below.  You may return to the Emergency Department at any time for worsening condition or any new symptoms that concern you.   If you develop increased redness or swelling, worsening pain, or fevers greater than 100.4, return to the ER immediately for a recheck.

## 2016-06-14 NOTE — ED Triage Notes (Signed)
Pt reports small lump developed in left breast 2 weeks ago, [facility] prescribed keflex for mastitis, no improvement, changed to Augmentin. Redness, size, and pain worsened 2 days ago. Fever up to 100.4.   Pt had SVD 6 weeks. Pt's OBGYN advised pt to come in.   Pt has concurrent unproductive cough, clear/yellow postnasal drip, head congestion, body aches. No diarrhea, emesis, abdominal pain.

## 2016-06-16 ENCOUNTER — Other Ambulatory Visit: Payer: Self-pay | Admitting: Obstetrics and Gynecology

## 2016-06-16 DIAGNOSIS — N644 Mastodynia: Secondary | ICD-10-CM

## 2016-06-17 ENCOUNTER — Other Ambulatory Visit: Payer: Self-pay | Admitting: Obstetrics and Gynecology

## 2016-06-17 ENCOUNTER — Ambulatory Visit
Admission: RE | Admit: 2016-06-17 | Discharge: 2016-06-17 | Disposition: A | Discharge status: Home / Self Care | Payer: PRIVATE HEALTH INSURANCE | Source: Ambulatory Visit | Attending: Obstetrics and Gynecology | Admitting: Obstetrics and Gynecology

## 2016-06-17 DIAGNOSIS — N644 Mastodynia: Secondary | ICD-10-CM

## 2016-06-17 DIAGNOSIS — N611 Abscess of the breast and nipple: Secondary | ICD-10-CM

## 2016-06-18 ENCOUNTER — Other Ambulatory Visit: Payer: Self-pay

## 2016-06-18 MED FILL — SULFAMETHOXAZOLE/TMP DS TAB: 800-160 | 10 days supply | Qty: 20 | Fill #0

## 2016-06-19 LAB — AEROBIC/ANAEROBIC CULTURE W GRAM STAIN (SURGICAL/DEEP WOUND)

## 2016-06-20 ENCOUNTER — Telehealth: Payer: Self-pay

## 2016-06-20 NOTE — Telephone Encounter (Signed)
Called Pt as F/U on culture report form ED visit. [facility] AB[facility]

## 2016-06-20 NOTE — Progress Notes (Signed)
ED Antimicrobial Stewardship Positive Culture Follow Up   Katrina GuntherSarah C Parrish is an 36 y.o. female who [facility] with a chief complaint of  Chief Complaint  Patient presents with  . Breast Pain    Recent Results (from the past 720 hour(s))  Aerobic/Anaerobic Culture (surgical/deep wound)     Status: None   Collection Time: 06/14/16  3:18 PM  Result Value Ref Range Status   Specimen Description BREAST ABSCESS  Final   Special Requests NONE  Final   Gram Stain   Final    ABUNDANT WBC PRESENT, PREDOMINANTLY PMN ABUNDANT GRAM POSITIVE COCCI IN CLUSTERS    Culture   Final    ABUNDANT STAPHYLOCOCCUS AUREUS NO ANAEROBES ISOLATED Performed at [facility]    Report Status 06/19/2016 FINAL  Final   Organism ID, Bacteria STAPHYLOCOCCUS AUREUS  Final      Susceptibility   Staphylococcus aureus - MIC*    CIPROFLOXACIN <=0.5 SENSITIVE Sensitive     ERYTHROMYCIN 1 RESISTANT Resistant     GENTAMICIN <=0.5 SENSITIVE Sensitive     OXACILLIN 0.5 SENSITIVE Sensitive     TETRACYCLINE <=1 SENSITIVE Sensitive     VANCOMYCIN <=0.5 SENSITIVE Sensitive     TRIMETH/SULFA <=10 SENSITIVE Sensitive     CLINDAMYCIN <=0.25 RESISTANT Resistant     RIFAMPIN <=0.5 SENSITIVE Sensitive     Inducible Clindamycin POSITIVE Resistant     * ABUNDANT STAPHYLOCOCCUS AUREUS    [x]  Treated with keflex>>augmentin []  Patient discharged originally without antimicrobial agent and treatment is now indicated  New antibiotic prescription: F/u improvement on current antibiotic regimen, it no improvement, need to return to the ED for further evaluation  ED Provider: Harolyn RutherfordShawn Joy, PA   Darrelyn Morro, Drake Leachachel Lynn 06/20/2016, 10:31 AM Clinical Pharmacist Phone# [phone]

## 2016-06-29 ENCOUNTER — Ambulatory Visit
Admission: RE | Admit: 2016-06-29 | Discharge: 2016-06-29 | Disposition: A | Discharge status: Home / Self Care | Payer: PRIVATE HEALTH INSURANCE | Source: Ambulatory Visit | Attending: Obstetrics and Gynecology | Admitting: Obstetrics and Gynecology

## 2016-06-29 ENCOUNTER — Other Ambulatory Visit: Payer: Self-pay | Admitting: Obstetrics and Gynecology

## 2016-06-29 DIAGNOSIS — N611 Abscess of the breast and nipple: Secondary | ICD-10-CM

## 2016-08-14 ENCOUNTER — Other Ambulatory Visit (HOSPITAL_COMMUNITY)
Admission: RE | Admit: 2016-08-14 | Discharge: 2016-08-14 | Disposition: A | Discharge status: Home / Self Care | Payer: PRIVATE HEALTH INSURANCE | Source: Ambulatory Visit | Attending: Obstetrics and Gynecology | Admitting: Obstetrics and Gynecology

## 2016-08-14 ENCOUNTER — Other Ambulatory Visit: Payer: Self-pay | Admitting: Obstetrics and Gynecology

## 2016-08-14 DIAGNOSIS — Z1151 Encounter for screening for human papillomavirus (HPV): Secondary | ICD-10-CM | POA: Insufficient documentation

## 2016-08-14 DIAGNOSIS — Z01419 Encounter for gynecological examination (general) (routine) without abnormal findings: Secondary | ICD-10-CM | POA: Insufficient documentation

## 2016-08-18 LAB — CYTOLOGY - PAP
DIAGNOSIS: NEGATIVE
HPV: NOT DETECTED

## 2016-09-28 ENCOUNTER — Ambulatory Visit
Admission: RE | Admit: 2016-09-28 | Discharge: 2016-09-28 | Disposition: A | Discharge status: Home / Self Care | Payer: PRIVATE HEALTH INSURANCE | Source: Ambulatory Visit | Attending: Obstetrics and Gynecology | Admitting: Obstetrics and Gynecology

## 2016-09-28 DIAGNOSIS — N611 Abscess of the breast and nipple: Secondary | ICD-10-CM

## 2017-05-04 MED FILL — predniSONE 10 MG TABS: 10 | 12 days supply | Qty: 48 | Fill #0

## 2017-07-09 MED FILL — SUMATRIPTAN SUCC 50 MG TABL: 50 | 30 days supply | Qty: 9 | Fill #0

## 2017-07-09 MED FILL — ONDANSETRON ODT 4 MG TABLET: 4 | 15 days supply | Qty: 9 | Fill #0

## 2017-07-21 MED FILL — predniSONE 10 MG TABS: 10 | 12 days supply | Qty: 48 | Fill #0

## 2017-09-16 MED FILL — AMOXICILLIN 875 MG TABLET: 875 | 10 days supply | Qty: 20 | Fill #0

## 2018-01-11 MED FILL — TRANEXAMIC ACID 650 MG TAB: 650 | 5 days supply | Qty: 30 | Fill #0

## 2018-02-15 LAB — OB RESULTS CONSOLE ANTIBODY SCREEN: Antibody Screen: NEGATIVE

## 2018-02-15 LAB — OB RESULTS CONSOLE HEPATITIS B SURFACE ANTIGEN: Hepatitis B Surface Ag: NEGATIVE

## 2018-02-15 LAB — OB RESULTS CONSOLE ABO/RH: RH Type: POSITIVE

## 2018-02-15 LAB — OB RESULTS CONSOLE GC/CHLAMYDIA
Chlamydia: NEGATIVE
Gonorrhea: NEGATIVE

## 2018-02-15 LAB — OB RESULTS CONSOLE RUBELLA ANTIBODY, IGM: Rubella: IMMUNE

## 2018-02-15 LAB — OB RESULTS CONSOLE RPR: RPR: NONREACTIVE

## 2018-02-15 LAB — OB RESULTS CONSOLE HIV ANTIBODY (ROUTINE TESTING): HIV: NONREACTIVE

## 2018-05-16 MED FILL — AMOXICILLIN 875 MG TABLET: 875 | 10 days supply | Qty: 20 | Fill #0

## 2018-06-15 NOTE — L&D Delivery Note (Signed)
Delivery Note At 10:31 PM a viable female was delivered via Vaginal, Spontaneous (Presentation:LOA  ).  APGAR: 9, 9; weight Pending .   Placenta status: spontaneous, intact .  Cord:  with the following complications: None.  Cord pH: n/a  Anesthesia:  Epidural Episiotomy: None Lacerations: 1st degree Suture Repair: 3.0 chromic Est. Blood Loss (mL):    Mom to postpartum.  Baby to Couplet care / Skin to Skin. Couple desires circumcision.  Briefly counseled.  Geryl Rankins 09/26/2018, 10:55 PM

## 2018-09-06 LAB — OB RESULTS CONSOLE GBS: GBS: NEGATIVE

## 2018-09-20 ENCOUNTER — Encounter (HOSPITAL_COMMUNITY): Payer: Self-pay | Admitting: *Deleted

## 2018-09-20 ENCOUNTER — Telehealth (HOSPITAL_COMMUNITY): Payer: Self-pay | Admitting: *Deleted

## 2018-09-20 NOTE — Telephone Encounter (Signed)
Preadmission screen  

## 2018-09-25 ENCOUNTER — Other Ambulatory Visit: Payer: Self-pay | Admitting: Obstetrics and Gynecology

## 2018-09-26 ENCOUNTER — Inpatient Hospital Stay (HOSPITAL_COMMUNITY)
Admission: AD | Admit: 2018-09-26 | Discharge: 2018-09-28 | DRG: 807 | Disposition: A | Discharge status: Home / Self Care | Payer: PRIVATE HEALTH INSURANCE | Attending: Obstetrics and Gynecology | Admitting: Obstetrics and Gynecology

## 2018-09-26 ENCOUNTER — Inpatient Hospital Stay (HOSPITAL_COMMUNITY): Payer: PRIVATE HEALTH INSURANCE | Admitting: Anesthesiology

## 2018-09-26 ENCOUNTER — Encounter (HOSPITAL_COMMUNITY): Payer: Self-pay

## 2018-09-26 ENCOUNTER — Other Ambulatory Visit: Payer: Self-pay

## 2018-09-26 DIAGNOSIS — Z3A38 38 weeks gestation of pregnancy: Secondary | ICD-10-CM | POA: Diagnosis not present

## 2018-09-26 DIAGNOSIS — O26893 Other specified pregnancy related conditions, third trimester: Secondary | ICD-10-CM | POA: Diagnosis present

## 2018-09-26 DIAGNOSIS — O4292 Full-term premature rupture of membranes, unspecified as to length of time between rupture and onset of labor: Principal | ICD-10-CM | POA: Diagnosis present

## 2018-09-26 DIAGNOSIS — Z3490 Encounter for supervision of normal pregnancy, unspecified, unspecified trimester: Secondary | ICD-10-CM

## 2018-09-26 DIAGNOSIS — Z3689 Encounter for other specified antenatal screening: Secondary | ICD-10-CM

## 2018-09-26 LAB — CBC
HCT: 38.1 % (ref 36.0–46.0)
Hemoglobin: 12.7 g/dL (ref 12.0–15.0)
MCH: 29.4 pg (ref 26.0–34.0)
MCHC: 33.3 g/dL (ref 30.0–36.0)
MCV: 88.2 fL (ref 80.0–100.0)
Platelets: 205 10*3/uL (ref 150–400)
RBC: 4.32 MIL/uL (ref 3.87–5.11)
RDW: 12.3 % (ref 11.5–15.5)
WBC: 9.9 10*3/uL (ref 4.0–10.5)
nRBC: 0 % (ref 0.0–0.2)

## 2018-09-26 LAB — TYPE AND SCREEN
ABO/RH(D): O POS
Antibody Screen: NEGATIVE

## 2018-09-26 LAB — ABO/RH: ABO/RH(D): O POS

## 2018-09-26 LAB — POCT FERN TEST: POCT Fern Test: POSITIVE

## 2018-09-26 MED ORDER — ACETAMINOPHEN 325 MG PO TABS: 650.0000 mg | ORAL_TABLET | ORAL | Status: DC | PRN

## 2018-09-26 MED ORDER — LACTATED RINGERS IV SOLN: 500.0000 mL | INTRAVENOUS | Status: DC | PRN

## 2018-09-26 MED ORDER — OXYTOCIN 40 UNITS IN NORMAL SALINE INFUSION - SIMPLE MED
2.5000 [IU]/h | INTRAVENOUS | Status: DC
  Administered 2018-09-26: 23:00:00 2.5 [IU]/h via INTRAVENOUS

## 2018-09-26 MED ORDER — FENTANYL CITRATE (PF) 100 MCG/2ML IJ SOLN: 50.0000 ug | INTRAMUSCULAR | Status: DC | PRN

## 2018-09-26 MED ORDER — SODIUM CHLORIDE (PF) 0.9 % IJ SOLN
INTRAMUSCULAR | Status: DC | PRN
  Administered 2018-09-26: 8 mL/h via EPIDURAL

## 2018-09-26 MED ORDER — LIDOCAINE-EPINEPHRINE (PF) 2 %-1:200000 IJ SOLN
INTRAMUSCULAR | Status: DC | PRN
  Administered 2018-09-26: 3 mL via EPIDURAL

## 2018-09-26 MED ORDER — SOD CITRATE-CITRIC ACID 500-334 MG/5ML PO SOLN: 30.0000 mL | ORAL | Status: DC | PRN

## 2018-09-26 MED ORDER — OXYTOCIN 40 UNITS IN NORMAL SALINE INFUSION - SIMPLE MED
1.0000 m[IU]/min | INTRAVENOUS | Status: DC
  Administered 2018-09-26: 2 m[IU]/min via INTRAVENOUS

## 2018-09-26 MED ORDER — LACTATED RINGERS IV SOLN
INTRAVENOUS | Status: DC
  Administered 2018-09-26 (×2): via INTRAVENOUS

## 2018-09-26 MED ORDER — OXYTOCIN BOLUS FROM INFUSION
500.0000 mL | Freq: Once | INTRAVENOUS | Status: DC
  Administered 2018-09-26: 23:00:00 500 mL via INTRAVENOUS

## 2018-09-26 MED ORDER — TERBUTALINE SULFATE 1 MG/ML IJ SOLN: 0.2500 mg | Freq: Once | INTRAMUSCULAR | Status: DC | PRN

## 2018-09-26 MED ORDER — OXYTOCIN 40 UNITS IN NORMAL SALINE INFUSION - SIMPLE MED
INTRAVENOUS | Status: AC
  Filled 2018-09-26: qty 1000

## 2018-09-26 MED ORDER — FENTANYL-BUPIVACAINE-NACL 0.5-0.125-0.9 MG/250ML-% EP SOLN
12.0000 mL/h | EPIDURAL | Status: DC | PRN
  Filled 2018-09-26: qty 250

## 2018-09-26 MED ORDER — EPHEDRINE 5 MG/ML INJ: 10.0000 mg | INTRAVENOUS | Status: DC | PRN

## 2018-09-26 MED ORDER — LIDOCAINE HCL (PF) 1 % IJ SOLN
30.0000 mL | INTRAMUSCULAR | Status: DC | PRN
  Filled 2018-09-26: qty 30

## 2018-09-26 MED ORDER — PHENYLEPHRINE 40 MCG/ML (10ML) SYRINGE FOR IV PUSH (FOR BLOOD PRESSURE SUPPORT): 80.0000 ug | PREFILLED_SYRINGE | INTRAVENOUS | Status: DC | PRN

## 2018-09-26 MED ORDER — PHENYLEPHRINE 40 MCG/ML (10ML) SYRINGE FOR IV PUSH (FOR BLOOD PRESSURE SUPPORT)
80.0000 ug | PREFILLED_SYRINGE | INTRAVENOUS | Status: DC | PRN
  Filled 2018-09-26: qty 10

## 2018-09-26 MED ORDER — OXYCODONE-ACETAMINOPHEN 5-325 MG PO TABS: 1.0000 | ORAL_TABLET | ORAL | Status: DC | PRN

## 2018-09-26 MED ORDER — ONDANSETRON HCL 4 MG/2ML IJ SOLN: 4.0000 mg | Freq: Four times a day (QID) | INTRAMUSCULAR | Status: DC | PRN

## 2018-09-26 MED ORDER — DIPHENHYDRAMINE HCL 50 MG/ML IJ SOLN: 12.5000 mg | INTRAMUSCULAR | Status: DC | PRN

## 2018-09-26 MED ORDER — LACTATED RINGERS IV SOLN
500.0000 mL | Freq: Once | INTRAVENOUS | Status: AC
  Administered 2018-09-26: 500 mL via INTRAVENOUS

## 2018-09-26 MED ORDER — OXYCODONE-ACETAMINOPHEN 5-325 MG PO TABS: 2.0000 | ORAL_TABLET | ORAL | Status: DC | PRN
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
## 2018-09-26 NOTE — MAU Provider Note (Signed)
Pt informed that the ultrasound is considered a limited OB ultrasound and is not intended to be a complete ultrasound exam.  Patient also informed that the ultrasound is not being completed with the intent of assessing for fetal or placental anomalies or any pelvic abnormalities.  Explained that the purpose of today's ultrasound is to assess for  presentation.  Patient acknowledges the purpose of the exam and the limitations of the study.    Vertex presentation confirmed by [place], pelvic examination being deferred at this time due to SROM.  Sharyon Cable, CNM 09/26/18, 3:24 PM

## 2018-09-26 NOTE — Anesthesia Procedure Notes (Signed)
Epidural Patient location during procedure: OB Start time: 09/26/2018 5:30 PM End time: 09/26/2018 5:45 PM  Staffing Anesthesiologist: Elmer Picker, MD Performed: anesthesiologist   Preanesthetic Checklist Completed: patient identified, pre-op evaluation, timeout performed, IV checked, risks and benefits discussed and monitors and equipment checked  Epidural Patient position: sitting Prep: site prepped and draped and DuraPrep Patient monitoring: continuous pulse ox, blood pressure, heart rate and cardiac monitor Approach: midline Location: L3-L4 Injection technique: LOR air  Needle:  Needle type: Tuohy  Needle gauge: 17 G Needle length: 9 cm Needle insertion depth: 5 cm Catheter type: closed end flexible Catheter size: 19 Gauge Catheter at skin depth: 10 cm Test dose: negative  Assessment Sensory level: T8 Events: blood not aspirated, injection not painful, no injection resistance, negative IV test and no paresthesia  Additional Notes Patient identified. Risks/Benefits/Options discussed with patient including but not limited to bleeding, infection, nerve damage, paralysis, failed block, incomplete pain control, headache, blood pressure changes, nausea, vomiting, reactions to medication both or allergic, itching and postpartum back pain. Confirmed with bedside nurse the patient's most recent platelet count. Confirmed with patient that they are not currently taking any anticoagulation, have any bleeding history or any family history of bleeding disorders. Patient expressed understanding and wished to proceed. All questions were answered. Sterile technique was used throughout the entire procedure. Please see nursing notes for vital signs. Test dose was given through epidural catheter and negative prior to continuing to dose epidural or start infusion. Warning signs of high block given to the patient including shortness of breath, tingling/numbness in hands, complete motor block,  or any concerning symptoms with instructions to call for help. Patient was given instructions on fall risk and not to get out of bed. All questions and concerns addressed with instructions to call with any issues or inadequate analgesia.  Reason for block:procedure for pain

## 2018-09-26 NOTE — Anesthesia Preprocedure Evaluation (Addendum)
Anesthesia Evaluation  Patient identified by MRN, date of birth, ID band Patient awake    Reviewed: Allergy & Precautions, NPO status , Patient's Chart, lab work & pertinent test results  History of Anesthesia Complications (+) PONV and history of anesthetic complications  Airway Mallampati: I  TM Distance: >3 FB Neck ROM: Full    Dental no notable dental hx.    Pulmonary asthma ,    Pulmonary exam normal breath sounds clear to auscultation       Cardiovascular negative cardio ROS Normal cardiovascular exam Rhythm:Regular Rate:Normal     Neuro/Psych  Headaches, negative psych ROS   GI/Hepatic negative GI ROS, Neg liver ROS,   Endo/Other  negative endocrine ROS  Renal/GU negative Renal ROS  negative genitourinary   Musculoskeletal negative musculoskeletal ROS (+)   Abdominal   Peds  Hematology negative hematology ROS (+)   Anesthesia Other Findings   Reproductive/Obstetrics (+) Pregnancy                             Anesthesia Physical Anesthesia Plan  ASA: II  Anesthesia Plan: Epidural   Post-op Pain Management:    Induction:   PONV Risk Score and Plan: Treatment may vary due to age or medical condition  Airway Management Planned: Natural Airway  Additional Equipment:   Intra-op Plan:   Post-operative Plan:   Informed Consent: I have reviewed the patients History and Physical, chart, labs and discussed the procedure including the risks, benefits and alternatives for the proposed anesthesia with the patient or authorized representative who has indicated his/her understanding and acceptance.       Plan Discussed with: Anesthesiologist  Anesthesia Plan Comments: (Patient identified. Risks, benefits, options discussed with patient including but not limited to bleeding, infection, nerve damage, paralysis, failed block, incomplete pain control, headache, blood pressure  changes, nausea, vomiting, reactions to medication, itching, and post partum back pain. Confirmed with bedside nurse the patient's most recent platelet count. Confirmed with the patient that they are not taking any anticoagulation, have any bleeding history or any family history of bleeding disorders. Patient expressed understanding and wishes to proceed. All questions were answered. )        Anesthesia Quick Evaluation

## 2018-09-26 NOTE — MAU Note (Signed)
Pt presents to MAU with c/o PROM. Pt states she felt large gush of fluid around 1400, since then has continued to leak. She denies VB and ctx. +FM

## 2018-09-27 ENCOUNTER — Other Ambulatory Visit: Payer: Self-pay

## 2018-09-27 ENCOUNTER — Encounter (HOSPITAL_COMMUNITY): Payer: Self-pay

## 2018-09-27 LAB — CBC
HCT: 33.7 % — ABNORMAL LOW (ref 36.0–46.0)
Hemoglobin: 11.2 g/dL — ABNORMAL LOW (ref 12.0–15.0)
MCH: 29.1 pg (ref 26.0–34.0)
MCHC: 33.2 g/dL (ref 30.0–36.0)
MCV: 87.5 fL (ref 80.0–100.0)
Platelets: 169 10*3/uL (ref 150–400)
RBC: 3.85 MIL/uL — ABNORMAL LOW (ref 3.87–5.11)
RDW: 12.3 % (ref 11.5–15.5)
WBC: 14.2 10*3/uL — ABNORMAL HIGH (ref 4.0–10.5)
nRBC: 0 % (ref 0.0–0.2)

## 2018-09-27 LAB — RPR: RPR Ser Ql: NONREACTIVE

## 2018-09-27 MED ORDER — TETANUS-DIPHTH-ACELL PERTUSSIS 5-2.5-18.5 LF-MCG/0.5 IM SUSP: 0.5000 mL | Freq: Once | INTRAMUSCULAR | Status: DC

## 2018-09-27 MED ORDER — WITCH HAZEL-GLYCERIN EX PADS: 1.0000 "application " | MEDICATED_PAD | CUTANEOUS | Status: DC | PRN

## 2018-09-27 MED ORDER — DIPHENHYDRAMINE HCL 25 MG PO CAPS: 25.0000 mg | ORAL_CAPSULE | Freq: Four times a day (QID) | ORAL | Status: DC | PRN

## 2018-09-27 MED ORDER — SIMETHICONE 80 MG PO CHEW: 80.0000 mg | CHEWABLE_TABLET | ORAL | Status: DC | PRN

## 2018-09-27 MED ORDER — MAGNESIUM HYDROXIDE 400 MG/5ML PO SUSP: 30.0000 mL | ORAL | Status: DC | PRN

## 2018-09-27 MED ORDER — OXYTOCIN 40 UNITS IN NORMAL SALINE INFUSION - SIMPLE MED: 2.5000 [IU]/h | INTRAVENOUS | Status: DC | PRN

## 2018-09-27 MED ORDER — PRENATAL MULTIVITAMIN CH
1.0000 | ORAL_TABLET | Freq: Every day | ORAL | Status: DC
  Administered 2018-09-27 – 2018-09-28 (×2): 1 via ORAL
  Filled 2018-09-27 (×2): qty 1

## 2018-09-27 MED ORDER — OXYCODONE HCL 5 MG PO TABS: 10.0000 mg | ORAL_TABLET | ORAL | Status: DC | PRN

## 2018-09-27 MED ORDER — IBUPROFEN 600 MG PO TABS
600.0000 mg | ORAL_TABLET | Freq: Four times a day (QID) | ORAL | Status: DC
  Administered 2018-09-27 – 2018-09-28 (×7): 600 mg via ORAL
  Filled 2018-09-27 (×7): qty 1

## 2018-09-27 MED ORDER — OXYCODONE HCL 5 MG PO TABS: 5.0000 mg | ORAL_TABLET | ORAL | Status: DC | PRN

## 2018-09-27 MED ORDER — METHYLERGONOVINE MALEATE 0.2 MG PO TABS: 0.2000 mg | ORAL_TABLET | ORAL | Status: DC | PRN

## 2018-09-27 MED ORDER — METHYLERGONOVINE MALEATE 0.2 MG/ML IJ SOLN: 0.2000 mg | INTRAMUSCULAR | Status: DC | PRN

## 2018-09-27 MED ORDER — COCONUT OIL OIL: 1.0000 "application " | TOPICAL_OIL | Status: DC | PRN

## 2018-09-27 MED ORDER — DIBUCAINE (PERIANAL) 1 % EX OINT: 1.0000 "application " | TOPICAL_OINTMENT | CUTANEOUS | Status: DC | PRN

## 2018-09-27 MED ORDER — SENNOSIDES-DOCUSATE SODIUM 8.6-50 MG PO TABS
2.0000 | ORAL_TABLET | ORAL | Status: DC
  Administered 2018-09-27 (×2): 2 via ORAL
  Filled 2018-09-27 (×2): qty 2

## 2018-09-27 MED ORDER — ZOLPIDEM TARTRATE 5 MG PO TABS: 5.0000 mg | ORAL_TABLET | Freq: Every evening | ORAL | Status: DC | PRN

## 2018-09-27 MED ORDER — ONDANSETRON HCL 4 MG/2ML IJ SOLN: 4.0000 mg | INTRAMUSCULAR | Status: DC | PRN

## 2018-09-27 MED ORDER — BENZOCAINE-MENTHOL 20-0.5 % EX AERO
1.0000 "application " | INHALATION_SPRAY | CUTANEOUS | Status: DC | PRN
  Filled 2018-09-27: qty 56

## 2018-09-27 MED ORDER — ONDANSETRON HCL 4 MG PO TABS: 4.0000 mg | ORAL_TABLET | ORAL | Status: DC | PRN

## 2018-09-27 MED ORDER — ACETAMINOPHEN 325 MG PO TABS: 650.0000 mg | ORAL_TABLET | ORAL | Status: DC | PRN

## 2018-09-27 NOTE — Anesthesia Postprocedure Evaluation (Signed)
Anesthesia Post Note  Patient: Katrina Parrish  Procedure(s) Performed: AN AD HOC LABOR EPIDURAL     Patient location during evaluation: Mother Baby Anesthesia Type: Epidural Level of consciousness: awake and alert Pain management: pain level controlled Vital Signs Assessment: post-procedure vital signs reviewed and stable Respiratory status: spontaneous breathing, nonlabored ventilation and respiratory function stable Cardiovascular status: stable Postop Assessment: no headache, no backache and epidural receding Anesthetic complications: no    Last Vitals:  Vitals:   09/26/18 2300 09/26/18 2315  BP: (!) 115/49 112/74  Pulse: 73 80  Resp: 18 18  Temp:    SpO2:      Last Pain:  Vitals:   09/26/18 2335  TempSrc:   PainSc: 0-No pain   Pain Goal:                   [name]

## 2018-09-27 NOTE — Progress Notes (Signed)
Postpartum Note Day # 1  S:  Patient resting comfortable in bed.  Pain controlled.  Tolerating general diet. + flatus, no BM.  Lochia moderate.  Ambulating without difficulty.  She denies n/v/f/c, SOB, or CP.  Pt plans on breastfeeding.  O: Temp:  [97.9 F (36.6 C)-99.1 F (37.3 C)] 98.1 F (36.7 C) (04/14 0515) Pulse Rate:  [60-95] 62 (04/14 0515) Resp:  [16-20] 16 (04/14 0515) BP: (102-138)/(49-88) 105/66 (04/14 0515) SpO2:  [99 %-100 %] 100 % (04/14 0515) Weight:  [63.5 kg] 63.5 kg (04/13 1555)   Gen: A&Ox3, NAD Resp: normal respiratory rate and effort Abdomen: soft, NT, ND Uterus: firm, non-tender, below umbilicus Ext: No edema, no calf tenderness bilaterally, SCDs in place  Labs:  Recent Labs    09/26/18 1522 09/27/18 0602  HGB 12.7 11.2*    A/P: Pt is a 38 y.o. [id number] s/p NSVD, PPD#1  - Pain well controlled -GU: voiding freely -GI: Tolerating general diet -Activity: encouraged sitting up to chair and ambulation as tolerated -Prophylaxis: early ambulation -Labs: stable as above -Discussed contraceptive options- due to migraines with aura and prior fractures she is leaning towards IUD -Baby boy circ to be completed  Continue with postpartum care, if possible from a pediatric standpoint will consider early discharge  Myna Hidalgo, DO [phone] (cell) [phone] (office)

## 2018-09-27 NOTE — Lactation Note (Signed)
This note was copied from a baby's chart. Lactation Consultation Note:  Mother is a P2 , she reports that she breastfed her 53/36 yr old for 1 yr.  Infant is 71 hours old and is 38.6 weeks. Mother sleeping when I arrived in the room. Infant also asleep in the crib at the bedside. Mother reports that infant has had 2 good feedings.  Suggested that mother page for staff nurse or LC to assess next feeding. Lactation brochure given to mother and reviewed basics of breastfeeding.  Encouraged to cue base feed and feed infant 8-12 times in 24 hours.   Mother informed of all LC services at [facility].   Patient Name: Katrina Parrish [id number] Date: 09/27/2018 Reason for consult: Early term 37-38.6wks;Initial assessment   Maternal Data Has patient been taught Hand Expression?: Yes Does the patient have breastfeeding experience prior to this delivery?: Yes  Feeding Feeding Type: (mother to page staff nurse or LC to obs next feeding. )  LATCH Score                   Interventions Interventions: Breast feeding basics reviewed;Hand express  Lactation Tools Discussed/Used WIC Program: No   Consult Status Consult Status: Follow-up Date: 09/27/18 Follow-up type: In-patient    Stevan Born [facility] 09/27/2018, 11:13 AM

## 2018-09-28 ENCOUNTER — Inpatient Hospital Stay (HOSPITAL_COMMUNITY): Payer: PRIVATE HEALTH INSURANCE

## 2018-09-28 ENCOUNTER — Ambulatory Visit: Payer: Self-pay

## 2018-09-28 MED ORDER — IBUPROFEN 600 MG PO TABS: 600.0000 mg | ORAL_TABLET | Freq: Four times a day (QID) | ORAL | 1 refills | Status: AC | PRN

## 2018-09-28 NOTE — Lactation Note (Addendum)
This note was copied from a baby's chart. Lactation Consultation Note:  Mother reports that she has slight discomfort on the initial latch.  Mother denies pink or tender nipples. Mother was advised to use ebm and then comfort gels were given for soreness.   Encouraged mother to do cross cradle hold or football hold.  Discouraged cradle hold without pillow support.   Advised mother to continue to cue base feed.  Feed infant at least 8-12 times or more in 24 hours. Discussed cluster feeding.  Mother reports that she had Mastitis that turned into an absess with last child.  Discussed S/S of Mastitis and advised mother to rotate positions and not skip feedings. Advised mother not to multi-task when home. Urged mother to nap frequently.   Mother reports that she had an over production with last child as well.  Advised mother to pump only for comfort and try to exclusively breastfeed infant.  Mother informed of available LC services. She is aware of 24/7 phone services for [facility] concerns or questions.   Patient Name: Katrina Parrish [id number] Date: 09/28/2018     Maternal Data    Feeding    LATCH Score                   Interventions    Lactation Tools Discussed/Used     Consult Status      Michel Bickers 09/28/2018, 2:58 PM

## 2018-09-28 NOTE — Discharge Summary (Signed)
OB Discharge Summary     Patient Name: Katrina GuntherSarah C Wax DOB: [birth year] MRN: [id number]  Date of admission: 09/26/2018 Delivering MD: Geryl RankinsVARNADO, EVELYN   Date of discharge: 09/28/2018  Admitting diagnosis: 39.6wks, water broke Intrauterine pregnancy: 10314w6d     Secondary diagnosis:  Active Problems:   Term pregnancy  Additional problems: None     Discharge diagnosis: Term Pregnancy Delivered                                                                                                Post partum procedures:None  Augmentation: None  Complications: None  Hospital course:  Onset of Labor With Vaginal Delivery     38 y.o. yo [id number] at 3514w6d was admitted in Active Labor on 09/26/2018. Patient had an uncomplicated labor course as follows:  Membrane Rupture Time/Date: 2:00 PM ,09/26/2018   Intrapartum Procedures: Episiotomy: None [1]                                         Lacerations:  1st degree [2]  Patient had a delivery of a Viable infant. 09/26/2018  Information for the patient's newborn:  Kimber RelicDesai, Boy Chelbi [[id number]][[id number]]  Delivery Method: Vaginal, Spontaneous(Filed from Delivery Summary)    Pateint had an uncomplicated postpartum course.  She is ambulating, tolerating a regular diet, passing flatus, and urinating well. Patient is discharged home in stable condition on 09/28/18.   Physical exam  Vitals:   09/27/18 0930 09/27/18 1400 09/27/18 2110 09/28/18 0529  BP: 112/77 115/73 112/67 129/80  Pulse: 85 64 80 (!) 56  Resp: 15 15 16 14   Temp: 97.8 F (36.6 C) 98.6 F (37 C) 98.3 F (36.8 C) 98 F (36.7 C)  TempSrc: Oral Oral Oral Oral  SpO2: 100% 100% 99% 99%  Weight:      Height:       General: alert, cooperative and no distress Lochia: appropriate Uterine Fundus: firm Incision: N/A DVT Evaluation: No evidence of DVT seen on physical exam. Labs: Lab Results  Component Value Date   WBC 14.2 (H) 09/27/2018   HGB 11.2 (L) 09/27/2018   HCT 33.7 (L) 09/27/2018   MCV  87.5 09/27/2018   PLT 169 09/27/2018   CMP Latest Ref Rng & Units 07/17/2008  Glucose 70 - 99 mg/dL 89  BUN 6 - 23 mg/dL 16  Creatinine 0.4 - 1.2 mg/dL 1.300.74  Sodium 865135 - 784145 mEq/L 135  Potassium 3.5 - 5.1 mEq/L 3.5  Chloride 96 - 112 mEq/L 109  CO2 19 - 32 mEq/L 22  Calcium 8.4 - 10.5 mg/dL 9.2    Discharge instruction: per After Visit Summary and "Baby and Me Booklet".  After visit meds:  Allergies as of 09/28/2018      Reactions   Tetracyclines & Related Nausea And Vomiting      Medication List    TAKE these medications   acetaminophen 325 MG tablet Commonly known as:  TYLENOL Take 650 mg by mouth every 6 (six) hours as  needed for mild pain or headache.   ibuprofen 600 MG tablet Commonly known as:  ADVIL,MOTRIN Take 1 tablet (600 mg total) by mouth every 6 (six) hours as needed.   magnesium gluconate 500 MG tablet Commonly known as:  MAGONATE Take 500 mg by mouth daily.   prenatal multivitamin Tabs tablet Take 1 tablet by mouth daily at 12 noon.   TURMERIC PO Take 1 capsule by mouth daily.       Diet: routine diet  Activity: Advance as tolerated. Pelvic rest for 6 weeks.   Outpatient follow up:6 weeks Follow up Appt:No future appointments. Follow up Visit:No follow-ups on file.  Postpartum contraception: Not Discussed  Newborn Data: Live born female  Birth Weight: 7 lb 5.1 oz (3320 g) APGAR: 9, 9  Newborn Delivery   Birth date/time:  09/26/2018 22:31:00 Delivery type:  Vaginal, Spontaneous     Baby Feeding: Breast Disposition:home with mother   09/28/2018 Gerald Leitz, MD

## 2018-10-04 ENCOUNTER — Inpatient Hospital Stay (HOSPITAL_COMMUNITY)
Admission: AD | Admit: 2018-10-04 | Payer: PRIVATE HEALTH INSURANCE | Source: Home / Self Care | Admitting: Obstetrics and Gynecology

## 2019-04-05 ENCOUNTER — Other Ambulatory Visit: Payer: Self-pay

## 2019-04-05 DIAGNOSIS — Z20822 Contact with and (suspected) exposure to covid-19: Secondary | ICD-10-CM

## 2019-04-07 LAB — NOVEL CORONAVIRUS, NAA: SARS-CoV-2, NAA: NOT DETECTED

## 2020-11-12 ENCOUNTER — Other Ambulatory Visit: Payer: Self-pay

## 2020-11-12 ENCOUNTER — Ambulatory Visit (INDEPENDENT_AMBULATORY_CARE_PROVIDER_SITE_OTHER): Payer: PRIVATE HEALTH INSURANCE | Admitting: Sports Medicine

## 2020-11-12 VITALS — BP 106/72 | Ht 65.0 in | Wt 120.0 lb

## 2020-11-12 DIAGNOSIS — S83004A Unspecified dislocation of right patella, initial encounter: Secondary | ICD-10-CM

## 2020-11-12 NOTE — Progress Notes (Addendum)
   Subjective:    Patient ID: Katrina Parrish, female    DOB: 07-Feb-1981, 40 y.o.   MRN: [id number]  HPI chief complaint: Right knee pain and swelling  Katrina Parrish presents today after having injured her right knee while competing in a polo cross tournament this past weekend.  Katrina Parrish suffered a blow to the lateral aspect of the right knee when another horse backed into her.  Katrina Parrish felt her patella dislocate medially.  It spontaneously reduced.  Katrina Parrish had pain and swelling immediately.  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  Katrina Parrish does endorse some catching occasionally in the right knee.  Katrina Parrish has been using compression and taking Relafen, both of which seem to be helping.  Katrina Parrish has another tournament in 3 weeks.  No prior right knee surgeries but Katrina Parrish did have a rather significant injury to the same knee in August of last year.  Katrina Parrish had pain and swelling at the time but was never evaluated by a physician.  Past medical history reviewed Medications reviewed Allergies reviewed    Review of Systems    As above Objective:   Physical Exam  Well-developed, well-nourished.  No acute distress  Right knee: Range of motion is 0 to about 100 degrees.  2+ effusion.  Katrina Parrish is tender to palpation diffusely around the medial knee.  Negative lateral patellar apprehension test.  Equivocal medial patellar apprehension test.  Knee is stable to valgus and varus stressing.  Negative Lachman, negative anterior drawer.  Negative posterior drawer.  Tender to palpation along the medial joint line.  Neurovascularly intact distally.  Walking with a limp.     Assessment & Plan:   Right knee pain and swelling status post patellar dislocation  Katrina Parrish will be fitted today with a patellar stabilizer brace with the buttress placed medially.  Katrina Parrish will also continue with compression, ice, and oral anti-inflammatories until her swelling subsides.  Katrina Parrish will start range  of motion and isometric quad exercises.  Katrina Parrish may add in wall walks as well.  Katrina Parrish may exercise on a stationary bike.  We will also get an MRI of her knee and I will follow-up with her with those results when available.  Katrina Parrish will follow-up with me again in clinic in two weeks.  Addendum: MRI was expedited.  Report is now available.  There is a small contusion in the medial trochlea and a mild sprain of the lateral patellar retinaculum consistent with medial patellar subluxation.  There is no tear of the lateral patellar retinaculum or the MPFL.  Cruciate ligaments are intact.  No meniscus injury.  Treatment as above and follow-up with me in 2 weeks.

## 2020-11-13 ENCOUNTER — Encounter: Payer: Self-pay | Admitting: Sports Medicine

## 2020-11-26 ENCOUNTER — Ambulatory Visit: Payer: PRIVATE HEALTH INSURANCE | Admitting: Sports Medicine

## 2020-12-18 ENCOUNTER — Other Ambulatory Visit (HOSPITAL_COMMUNITY): Payer: Self-pay

## 2021-09-10 ENCOUNTER — Other Ambulatory Visit: Payer: Self-pay | Admitting: Nurse Practitioner

## 2021-09-10 DIAGNOSIS — N644 Mastodynia: Secondary | ICD-10-CM

## 2021-10-07 ENCOUNTER — Other Ambulatory Visit: Payer: Self-pay | Admitting: Nurse Practitioner

## 2021-10-07 ENCOUNTER — Ambulatory Visit: Payer: No Typology Code available for payment source

## 2021-10-07 ENCOUNTER — Ambulatory Visit
Admission: RE | Admit: 2021-10-07 | Discharge: 2021-10-07 | Disposition: A | Discharge status: Home / Self Care | Payer: No Typology Code available for payment source | Source: Ambulatory Visit | Attending: Nurse Practitioner | Admitting: Nurse Practitioner

## 2021-10-07 DIAGNOSIS — R921 Mammographic calcification found on diagnostic imaging of breast: Secondary | ICD-10-CM

## 2021-10-07 DIAGNOSIS — N644 Mastodynia: Secondary | ICD-10-CM

## 2021-10-09 ENCOUNTER — Other Ambulatory Visit: Payer: Self-pay | Admitting: Nurse Practitioner

## 2021-10-09 DIAGNOSIS — Z803 Family history of malignant neoplasm of breast: Secondary | ICD-10-CM

## 2021-10-09 DIAGNOSIS — R922 Inconclusive mammogram: Secondary | ICD-10-CM

## 2021-10-23 ENCOUNTER — Ambulatory Visit
Admission: RE | Admit: 2021-10-23 | Discharge: 2021-10-23 | Disposition: A | Discharge status: Home / Self Care | Payer: No Typology Code available for payment source | Source: Ambulatory Visit | Attending: Nurse Practitioner | Admitting: Nurse Practitioner

## 2021-10-23 DIAGNOSIS — Z803 Family history of malignant neoplasm of breast: Secondary | ICD-10-CM

## 2021-10-23 DIAGNOSIS — R922 Inconclusive mammogram: Secondary | ICD-10-CM

## 2021-10-23 MED ORDER — GADOBUTROL 1 MMOL/ML IV SOLN
7.0000 mL | Freq: Once | INTRAVENOUS | Status: AC | PRN
  Administered 2021-10-23: 7 mL via INTRAVENOUS

## 2021-10-24 ENCOUNTER — Other Ambulatory Visit: Payer: Self-pay | Admitting: Nurse Practitioner

## 2021-10-24 DIAGNOSIS — R9389 Abnormal findings on diagnostic imaging of other specified body structures: Secondary | ICD-10-CM

## 2021-10-30 ENCOUNTER — Ambulatory Visit
Admission: RE | Admit: 2021-10-30 | Discharge: 2021-10-30 | Disposition: A | Discharge status: Home / Self Care | Payer: No Typology Code available for payment source | Source: Ambulatory Visit | Attending: Nurse Practitioner | Admitting: Nurse Practitioner

## 2021-10-30 DIAGNOSIS — R9389 Abnormal findings on diagnostic imaging of other specified body structures: Secondary | ICD-10-CM

## 2021-10-30 MED ORDER — GADOBUTROL 1 MMOL/ML IV SOLN
5.0000 mL | Freq: Once | INTRAVENOUS | Status: AC | PRN
  Administered 2021-10-30: 5 mL via INTRAVENOUS

## 2021-11-13 ENCOUNTER — Other Ambulatory Visit: Payer: Self-pay | Admitting: Nurse Practitioner

## 2021-11-13 DIAGNOSIS — Z803 Family history of malignant neoplasm of breast: Secondary | ICD-10-CM

## 2022-02-10 DIAGNOSIS — M9904 Segmental and somatic dysfunction of sacral region: Secondary | ICD-10-CM | POA: Diagnosis not present

## 2022-02-10 DIAGNOSIS — M9902 Segmental and somatic dysfunction of thoracic region: Secondary | ICD-10-CM | POA: Diagnosis not present

## 2022-02-23 ENCOUNTER — Other Ambulatory Visit (HOSPITAL_COMMUNITY): Payer: Self-pay

## 2022-02-23 MED ORDER — MONTELUKAST SODIUM 10 MG PO TABS
10.0000 mg | ORAL_TABLET | Freq: Every day | ORAL | 1 refills | Status: AC
  Filled 2022-02-23: qty 90, 90d supply, fill #0

## 2022-02-23 MED ORDER — ALBUTEROL SULFATE HFA 108 (90 BASE) MCG/ACT IN AERS
2.0000 | INHALATION_SPRAY | RESPIRATORY_TRACT | 0 refills | Status: AC | PRN
  Filled 2022-02-23: qty 6.7, 16d supply, fill #0

## 2022-03-02 DIAGNOSIS — M9902 Segmental and somatic dysfunction of thoracic region: Secondary | ICD-10-CM | POA: Diagnosis not present

## 2022-03-02 DIAGNOSIS — M9904 Segmental and somatic dysfunction of sacral region: Secondary | ICD-10-CM | POA: Diagnosis not present

## 2022-03-23 DIAGNOSIS — M9904 Segmental and somatic dysfunction of sacral region: Secondary | ICD-10-CM | POA: Diagnosis not present

## 2022-03-23 DIAGNOSIS — M9902 Segmental and somatic dysfunction of thoracic region: Secondary | ICD-10-CM | POA: Diagnosis not present

## 2022-04-09 ENCOUNTER — Ambulatory Visit
Admission: RE | Admit: 2022-04-09 | Discharge: 2022-04-09 | Disposition: A | Discharge status: Home / Self Care | Payer: BC Managed Care – PPO | Source: Ambulatory Visit | Attending: Nurse Practitioner | Admitting: Nurse Practitioner

## 2022-04-09 ENCOUNTER — Other Ambulatory Visit: Payer: Self-pay | Admitting: Nurse Practitioner

## 2022-04-09 DIAGNOSIS — R921 Mammographic calcification found on diagnostic imaging of breast: Secondary | ICD-10-CM | POA: Diagnosis not present

## 2022-04-10 ENCOUNTER — Ambulatory Visit
Admission: RE | Admit: 2022-04-10 | Discharge: 2022-04-10 | Disposition: A | Discharge status: Home / Self Care | Payer: BC Managed Care – PPO | Source: Ambulatory Visit | Attending: Nurse Practitioner | Admitting: Nurse Practitioner

## 2022-04-10 DIAGNOSIS — Z803 Family history of malignant neoplasm of breast: Secondary | ICD-10-CM

## 2022-04-10 DIAGNOSIS — N6489 Other specified disorders of breast: Secondary | ICD-10-CM | POA: Diagnosis not present

## 2022-04-10 MED ORDER — GADOPICLENOL 0.5 MMOL/ML IV SOLN
5.0000 mL | Freq: Once | INTRAVENOUS | Status: AC | PRN
  Administered 2022-04-10: 5 mL via INTRAVENOUS

## 2022-05-04 DIAGNOSIS — M9902 Segmental and somatic dysfunction of thoracic region: Secondary | ICD-10-CM | POA: Diagnosis not present

## 2022-05-04 DIAGNOSIS — M9904 Segmental and somatic dysfunction of sacral region: Secondary | ICD-10-CM | POA: Diagnosis not present

## 2022-05-25 DIAGNOSIS — M9904 Segmental and somatic dysfunction of sacral region: Secondary | ICD-10-CM | POA: Diagnosis not present

## 2022-05-25 DIAGNOSIS — M9902 Segmental and somatic dysfunction of thoracic region: Secondary | ICD-10-CM | POA: Diagnosis not present

## 2022-06-17 DIAGNOSIS — M9904 Segmental and somatic dysfunction of sacral region: Secondary | ICD-10-CM | POA: Diagnosis not present

## 2022-06-17 DIAGNOSIS — M9902 Segmental and somatic dysfunction of thoracic region: Secondary | ICD-10-CM | POA: Diagnosis not present

## 2022-07-08 DIAGNOSIS — M9904 Segmental and somatic dysfunction of sacral region: Secondary | ICD-10-CM | POA: Diagnosis not present

## 2022-07-08 DIAGNOSIS — M9902 Segmental and somatic dysfunction of thoracic region: Secondary | ICD-10-CM | POA: Diagnosis not present

## 2022-07-28 DIAGNOSIS — M9902 Segmental and somatic dysfunction of thoracic region: Secondary | ICD-10-CM | POA: Diagnosis not present

## 2022-07-28 DIAGNOSIS — M9904 Segmental and somatic dysfunction of sacral region: Secondary | ICD-10-CM | POA: Diagnosis not present

## 2022-08-25 DIAGNOSIS — M9904 Segmental and somatic dysfunction of sacral region: Secondary | ICD-10-CM | POA: Diagnosis not present

## 2022-08-25 DIAGNOSIS — M9902 Segmental and somatic dysfunction of thoracic region: Secondary | ICD-10-CM | POA: Diagnosis not present

## 2022-09-22 DIAGNOSIS — M9902 Segmental and somatic dysfunction of thoracic region: Secondary | ICD-10-CM | POA: Diagnosis not present

## 2022-09-22 DIAGNOSIS — M9904 Segmental and somatic dysfunction of sacral region: Secondary | ICD-10-CM | POA: Diagnosis not present

## 2022-09-30 ENCOUNTER — Ambulatory Visit
Admission: RE | Admit: 2022-09-30 | Discharge: 2022-09-30 | Disposition: A | Discharge status: Home / Self Care | Payer: BC Managed Care – PPO | Source: Ambulatory Visit | Attending: Nurse Practitioner | Admitting: Nurse Practitioner

## 2022-09-30 DIAGNOSIS — R921 Mammographic calcification found on diagnostic imaging of breast: Secondary | ICD-10-CM

## 2022-10-13 DIAGNOSIS — M9902 Segmental and somatic dysfunction of thoracic region: Secondary | ICD-10-CM | POA: Diagnosis not present

## 2022-10-13 DIAGNOSIS — M9904 Segmental and somatic dysfunction of sacral region: Secondary | ICD-10-CM | POA: Diagnosis not present

## 2022-11-10 DIAGNOSIS — M9904 Segmental and somatic dysfunction of sacral region: Secondary | ICD-10-CM | POA: Diagnosis not present

## 2022-11-10 DIAGNOSIS — M9902 Segmental and somatic dysfunction of thoracic region: Secondary | ICD-10-CM | POA: Diagnosis not present

## 2022-11-12 DIAGNOSIS — Z01419 Encounter for gynecological examination (general) (routine) without abnormal findings: Secondary | ICD-10-CM | POA: Diagnosis not present

## 2022-11-30 ENCOUNTER — Encounter: Payer: Self-pay | Admitting: Genetic Counselor

## 2022-11-30 ENCOUNTER — Other Ambulatory Visit: Payer: Self-pay

## 2022-11-30 ENCOUNTER — Other Ambulatory Visit: Payer: Self-pay | Admitting: Genetic Counselor

## 2022-11-30 ENCOUNTER — Inpatient Hospital Stay: Payer: BC Managed Care – PPO | Attending: Genetic Counselor | Admitting: Genetic Counselor

## 2022-11-30 ENCOUNTER — Inpatient Hospital Stay: Payer: BC Managed Care – PPO

## 2022-11-30 DIAGNOSIS — Z803 Family history of malignant neoplasm of breast: Secondary | ICD-10-CM

## 2022-11-30 DIAGNOSIS — Z8481 Family history of carrier of genetic disease: Secondary | ICD-10-CM | POA: Diagnosis not present

## 2022-11-30 LAB — GENETIC SCREENING ORDER
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
## 2022-12-01 DIAGNOSIS — M9902 Segmental and somatic dysfunction of thoracic region: Secondary | ICD-10-CM | POA: Diagnosis not present

## 2022-12-01 DIAGNOSIS — M9904 Segmental and somatic dysfunction of sacral region: Secondary | ICD-10-CM | POA: Diagnosis not present

## 2022-12-09 ENCOUNTER — Telehealth: Payer: Self-pay | Admitting: Genetic Counselor

## 2022-12-09 ENCOUNTER — Encounter: Payer: Self-pay | Admitting: Genetic Counselor

## 2022-12-09 ENCOUNTER — Ambulatory Visit: Payer: Self-pay | Admitting: Genetic Counselor

## 2022-12-09 DIAGNOSIS — Z803 Family history of malignant neoplasm of breast: Secondary | ICD-10-CM

## 2022-12-09 DIAGNOSIS — Z1379 Encounter for other screening for genetic and chromosomal anomalies: Secondary | ICD-10-CM

## 2022-12-09 DIAGNOSIS — Z9189 Other specified personal risk factors, not elsewhere classified: Secondary | ICD-10-CM

## 2022-12-09 NOTE — Telephone Encounter (Signed)
Disclosed negative genetics.  TC score ~27%.  Patient interested in referral to [facility] breast clinic.

## 2022-12-15 NOTE — Progress Notes (Signed)
HPI:   Katrina Parrish was previously seen in the  Cancer Genetics clinic due to a family history of breast cancer and concerns regarding a hereditary predisposition to cancer.    Katrina Parrish recent genetic test results were disclosed to her by telephone. These results and recommendations are discussed in more detail below.  CANCER HISTORY:  Oncology History   No history exists.    FAMILY HISTORY:  We obtained a detailed, 4-generation family history.  Significant diagnoses are listed below:      Family History  Problem Relation Age of Onset   Breast cancer Mother 66        lumpectomy    Lung cancer Father 37   Breast cancer Paternal Aunt          dx late 3s   Kidney cancer Paternal Aunt 5   Lung cancer Paternal Grandmother          dx 13s   Lung cancer Paternal Grandfather          dx 15s   Other Cousin          hereditary cancer gene mutation; limited info; RRM   Breast cancer Cousin 60        paternal female cousin       Katrina Parrish's paternal female cousin has a hereditary cancer mutation (in an unknown gene), which was detected more than 5 years ago.  She had a risk reducing mastectomy given her genetics results.  No report was available for review today. Katrina Parrish is unaware of any other previous family history of genetic testing for hereditary cancer risks.  Other relatives are unavailable for genetic testing at this time.    There is no reported Ashkenazi Jewish ancestry. There is no known consanguinity.  GENETIC TEST RESULTS:  The [employer] CustomNext-Cancer +RNA Panel found no pathogenic mutations.   The CustomNext-Cancer+RNAinsight panel offered by [facility] includes sequencing, rearrangement, and RNA analysis for the following 47 genes:  APC, ATM, AXIN2, BAP1, BARD1, BMPR1A, BRCA1, BRCA2, BRIP1, CDH1, CDK4, CDKN2A, CHEK2, CTNNA1, DICER1, EPCAM, FH, GREM1, HOXB13, KIT, MBD4, MEN1, MLH1, MSH2, MSH3, MSH6, MUTYH, NF1, NTHL1, PALB2, PDGFRA, PMS2, POLD1, POLE, PTEN,  RAD51C, RAD51D, SDHA, SDHB, SDHC, SDHD, SMAD4, SMARCA4, STK11, TP53, TSC1, TSC2, VHL.   Marland Kitchen   The test report has been scanned into EPIC and is located under the Molecular Pathology section of the Results Review tab.  A portion of the result report is included below for reference. Genetic testing reported out on December 08, 2022.      Even though a pathogenic variant was not identified, possible explanations for the cancer in the family may include: There may be no hereditary risk for cancer in the family. The cancers in Katrina Parrish's family may be sporadic/familial or due to other genetic and environmental factors. There may be a gene mutation in one of these genes that current testing methods cannot detect but that chance is small. There could be another gene that has not yet been discovered, or that we have not yet tested, that is responsible for the cancer diagnoses in the family.  It is also possible there is a hereditary cause for the cancer in the family that Katrina Parrish did not inherit.  Of note, Katrina Parrish reported a gene mutation in her paternal cousins.  We discussed the importance of obtaining a copy of report to ensure appropriate coverage of reported family mutation.    Therefore, it is important to remain in  touch with cancer genetics in the future so that we can continue to offer Katrina Parrish the most up to date genetic testing.     ADDITIONAL GENETIC TESTING:   Katrina Parrish genetic testing was fairly extensive.  If there are additional relevant genes identified to increase cancer risk that can be analyzed in the future, we would be happy to discuss and coordinate this testing at that time.     CANCER SCREENING RECOMMENDATIONS:  Katrina Parrish test result is considered negative (normal).  This means that we have not identified a hereditary cause for her family history of cancer at this time.   An individual's cancer risk and medical management are not determined by genetic test results  alone. Overall cancer risk assessment incorporates additional factors, including personal medical history, family history, and any available genetic information that may result in a personalized plan for cancer prevention and surveillance. Therefore, it is recommended she continue to follow the cancer management and screening guidelines provided by her primary healthcare provider.  Katrina Parrish has been determined to be at high risk for breast cancer.  Her Tyrer-Cuzick risk score is 26.9%.  This risk estimate can change over time and may be repeated to reflect new information in her personal or family history in the future.  For women with a greater than 20% lifetime risk of breast cancer, the [employer] (NCCN) recommends the following:  Clinical encounter every 6-12 months to begin when identified as being at increased risk, but not before age 63  Annual mammograms. Tomosynthesis is recommended starting 10 years earlier than the youngest breast cancer diagnosis in the family or at age 41 (whichever comes first), but not before age 90   Annual breast MRI starting 10 years earlier than the youngest breast cancer diagnosis in the family or at age 68 (whichever comes first), but not before age 69      We, therefore, discussed that it is reasonable for Katrina Parrish to be followed by a high-risk breast cancer clinic; in addition to a yearly mammogram and physical exam by a healthcare provider, she should discuss the usefulness of an annual breast MRI with  her referring provider and/or the high-risk clinic providers.  Katrina Parrish is interested in a referral to the high risk breast clinic, and a referral has been placed.     RECOMMENDATIONS FOR FAMILY MEMBERS:   Since she did not inherit a identifiable mutation in a cancer predisposition gene included on this panel, her children could not have inherited a known mutation from her in one of these genes. Individuals in this family might be at  some increased risk of developing cancer, over the general population risk, due to the family history of cancer.  Individuals in the family should notify their providers of the family history of cancer. We recommend women in this family have a yearly mammogram beginning at age 56, or 77 years younger than the earliest onset of cancer, an annual clinical breast exam, and perform monthly breast self-exams.  Risk models that take into account family history and hormonal history may be helpful in determining appropriate breast cancer screening options for family members.  Other members of the family may still carry a pathogenic variant in one of these genes that Katrina Parrish did not inherit. Based on the family history, we recommend relatives more closely related to her paternal cousins with the reported gene mutation have genetic counseling/testing.    FOLLOW-UP:  Cancer genetics is a rapidly  advancing field and it is possible that new genetic tests will be appropriate for her and/or her family members in the future. We encourage Katrina Parrish to remain in contact with cancer genetics, so we can update her personal and family histories and let her know of advances in cancer genetics that may benefit this family.   Our contact number was provided.  She knows she is welcome to call us at anytime with additional questions or concerns.   Gustie Bobb M. Rennie Plowman, MS, [facility] Genetic Counselor [email or web address] (P) [phone]

## 2022-12-21 DIAGNOSIS — M9904 Segmental and somatic dysfunction of sacral region: Secondary | ICD-10-CM | POA: Diagnosis not present

## 2022-12-21 DIAGNOSIS — M9902 Segmental and somatic dysfunction of thoracic region: Secondary | ICD-10-CM | POA: Diagnosis not present

## 2023-01-18 DIAGNOSIS — M9902 Segmental and somatic dysfunction of thoracic region: Secondary | ICD-10-CM | POA: Diagnosis not present

## 2023-01-18 DIAGNOSIS — M9904 Segmental and somatic dysfunction of sacral region: Secondary | ICD-10-CM | POA: Diagnosis not present

## 2023-02-09 DIAGNOSIS — M9904 Segmental and somatic dysfunction of sacral region: Secondary | ICD-10-CM | POA: Diagnosis not present

## 2023-02-09 DIAGNOSIS — M9902 Segmental and somatic dysfunction of thoracic region: Secondary | ICD-10-CM | POA: Diagnosis not present

## 2023-02-10 DIAGNOSIS — Z0000 Encounter for general adult medical examination without abnormal findings: Secondary | ICD-10-CM | POA: Diagnosis not present

## 2023-02-10 DIAGNOSIS — Z131 Encounter for screening for diabetes mellitus: Secondary | ICD-10-CM | POA: Diagnosis not present

## 2023-02-10 DIAGNOSIS — Z6820 Body mass index (BMI) 20.0-20.9, adult: Secondary | ICD-10-CM | POA: Diagnosis not present

## 2023-02-10 DIAGNOSIS — J4520 Mild intermittent asthma, uncomplicated: Secondary | ICD-10-CM | POA: Diagnosis not present

## 2023-02-10 DIAGNOSIS — Z8051 Family history of malignant neoplasm of kidney: Secondary | ICD-10-CM | POA: Diagnosis not present

## 2023-02-10 DIAGNOSIS — Z1330 Encounter for screening examination for mental health and behavioral disorders, unspecified: Secondary | ICD-10-CM | POA: Diagnosis not present

## 2023-02-10 DIAGNOSIS — G43E11 Chronic migraine with aura, intractable, with status migrainosus: Secondary | ICD-10-CM | POA: Diagnosis not present

## 2023-02-10 DIAGNOSIS — R319 Hematuria, unspecified: Secondary | ICD-10-CM | POA: Diagnosis not present

## 2023-02-11 DIAGNOSIS — R319 Hematuria, unspecified: Secondary | ICD-10-CM | POA: Diagnosis not present

## 2023-02-18 ENCOUNTER — Inpatient Hospital Stay: Payer: BC Managed Care – PPO | Attending: Genetic Counselor | Admitting: Hematology and Oncology

## 2023-02-18 VITALS — BP 108/64 | HR 79 | Temp 97.7°F | Resp 18 | Ht 65.0 in | Wt 117.5 lb

## 2023-02-18 DIAGNOSIS — Z1321 Encounter for screening for nutritional disorder: Secondary | ICD-10-CM | POA: Diagnosis not present

## 2023-02-18 DIAGNOSIS — Z801 Family history of malignant neoplasm of trachea, bronchus and lung: Secondary | ICD-10-CM | POA: Diagnosis not present

## 2023-02-18 DIAGNOSIS — Z130 Encounter for screening for diseases of the blood and blood-forming organs and certain disorders involving the immune mechanism: Secondary | ICD-10-CM | POA: Diagnosis not present

## 2023-02-18 DIAGNOSIS — Z803 Family history of malignant neoplasm of breast: Secondary | ICD-10-CM | POA: Diagnosis not present

## 2023-02-18 DIAGNOSIS — Z08 Encounter for follow-up examination after completed treatment for malignant neoplasm: Secondary | ICD-10-CM | POA: Insufficient documentation

## 2023-02-18 DIAGNOSIS — Z131 Encounter for screening for diabetes mellitus: Secondary | ICD-10-CM | POA: Diagnosis not present

## 2023-02-18 DIAGNOSIS — M79672 Pain in left foot: Secondary | ICD-10-CM | POA: Diagnosis not present

## 2023-02-18 DIAGNOSIS — Z1329 Encounter for screening for other suspected endocrine disorder: Secondary | ICD-10-CM | POA: Diagnosis not present

## 2023-02-18 DIAGNOSIS — Z13220 Encounter for screening for lipoid disorders: Secondary | ICD-10-CM | POA: Diagnosis not present

## 2023-02-18 DIAGNOSIS — M79671 Pain in right foot: Secondary | ICD-10-CM | POA: Diagnosis not present

## 2023-02-18 DIAGNOSIS — Z9189 Other specified personal risk factors, not elsewhere classified: Secondary | ICD-10-CM

## 2023-02-18 NOTE — Assessment & Plan Note (Signed)
Genetic testing: No mutations 1.  Risk assessment: [name] model: 5-year risk B.  Tyrer-Cuzick model: 10-year risk: 3.9% (average risk 1.5%) C.  Tyrer-Cuzick model: Lifetime risk: 26.9% (average risk 10.9%)  2. Risk reduction: A.  Pharmacological risk reduction: Tamoxifen versus raloxifene B.  Nonpharmacological risk reduction: Stress importance of eating healthy, diet, exercise, supplements like vitamin D and turmeric, avoiding alcohol  3.  Breast cancer surveillance: A.  Mammograms annually B.  Breast MRIs annually   Return to clinic in 1 year for follow-up
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
## 2023-03-01 DIAGNOSIS — M9904 Segmental and somatic dysfunction of sacral region: Secondary | ICD-10-CM | POA: Diagnosis not present

## 2023-03-01 DIAGNOSIS — M9902 Segmental and somatic dysfunction of thoracic region: Secondary | ICD-10-CM | POA: Diagnosis not present

## 2023-04-27 DIAGNOSIS — M9902 Segmental and somatic dysfunction of thoracic region: Secondary | ICD-10-CM | POA: Diagnosis not present

## 2023-04-27 DIAGNOSIS — M9904 Segmental and somatic dysfunction of sacral region: Secondary | ICD-10-CM | POA: Diagnosis not present

## 2023-05-18 DIAGNOSIS — M9902 Segmental and somatic dysfunction of thoracic region: Secondary | ICD-10-CM | POA: Diagnosis not present

## 2023-05-18 DIAGNOSIS — M9904 Segmental and somatic dysfunction of sacral region: Secondary | ICD-10-CM | POA: Diagnosis not present

## 2023-05-22 IMAGING — MR MR BREAST BILAT WO/W CM
8 of 12 series · 32 of 48 positions shown · IV contrast (7ML GADAVIST)
Comparison: No previous breast MRI. Comparison is made to bilateral
diagnostic mammogram dated 10/07/2021.

CLINICAL DATA: Family history of breast cancer. Occasional pain in
LEFT breast.

EXAM:
BILATERAL BREAST MRI WITH AND WITHOUT CONTRAST
TECHNIQUE: Multiplanar, multisequence MR images of both breasts were obtained
prior to and following the intravenous administration of 6 ml of
Gadavist

[Series 2: t2_tirm_tra ipat (a-p) · axial · 3.0mm · 0.70mm/px · 1 of 55 slices shown]
[im 1/55]
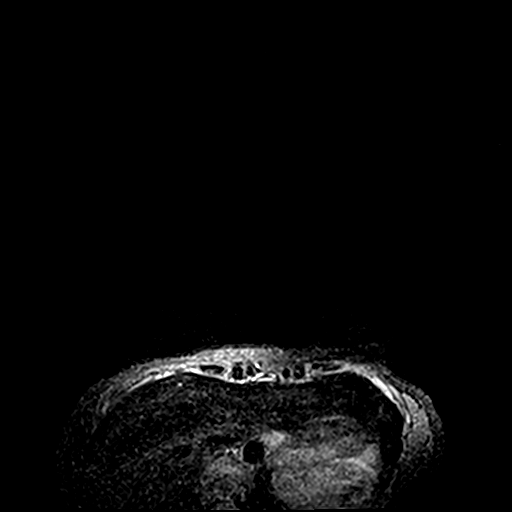

[Series 3: fl3d pre-cm no · axial · non-contrast · 1.2mm · 0.94mm/px · z∈[-102,+69]mm · 5 of 144 slices shown]
[im 1/144]
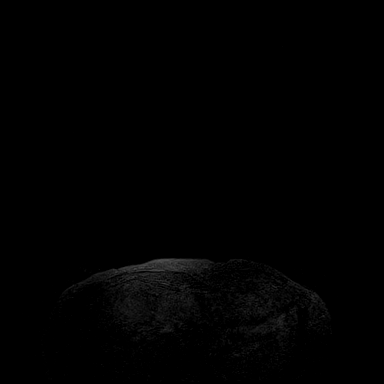
[im 36/144]
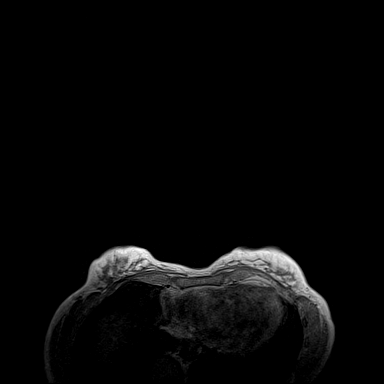
[im 72/144]
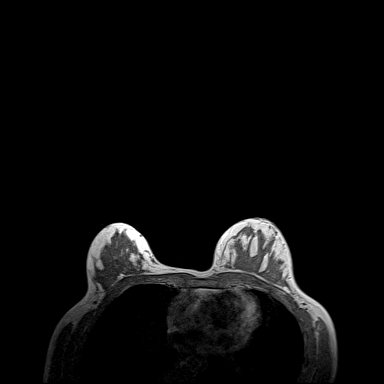
[im 108/144]
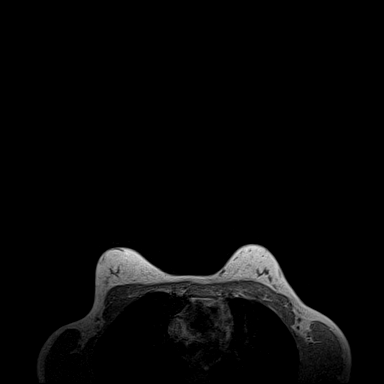
[im 144/144]
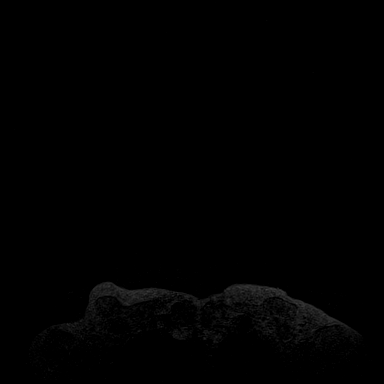

[Series 4: fl3d pre-cm · axial · non-contrast · 1.2mm · 0.94mm/px · z∈[-102,+69]mm · 5 of 144 slices shown]
[im 1/144]
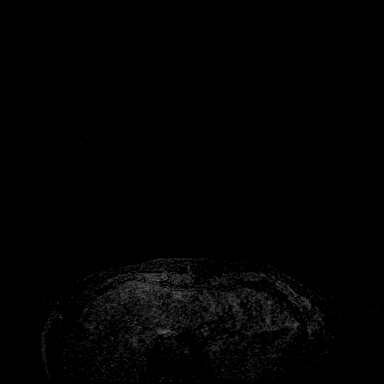
[im 36/144]
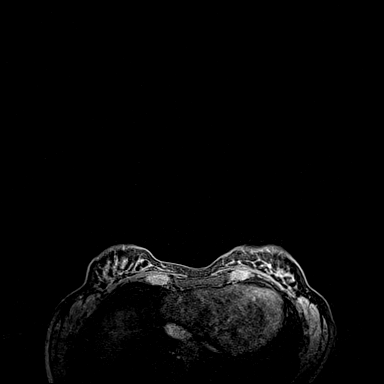
[im 72/144]
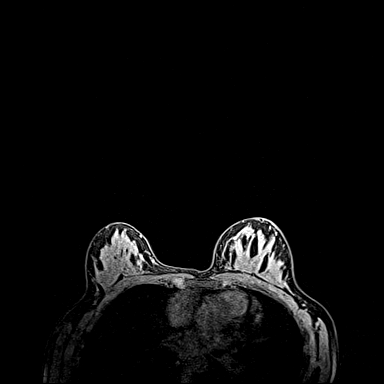
[im 108/144]
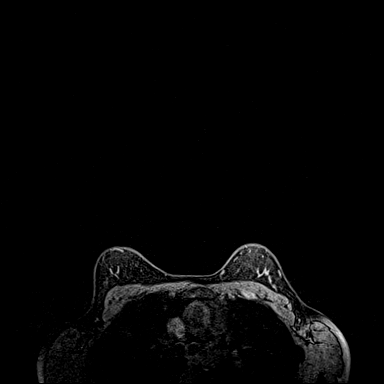
[im 144/144]
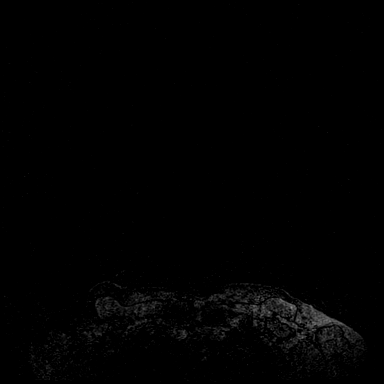

[Series 5: fl3d post immediate · axial · 1.2mm · 0.94mm/px · z∈[-102,+69]mm · 5 of 144 slices shown (1 of 3)]
[im 1/144]
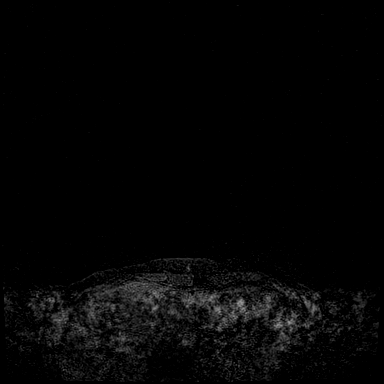
[im 36/144]
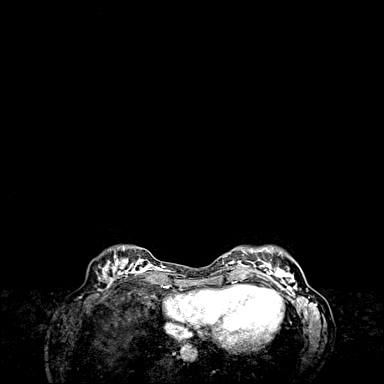
[im 72/144]
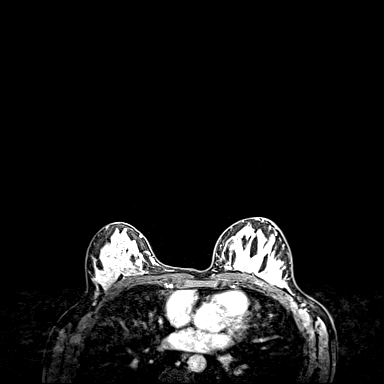
[im 108/144]
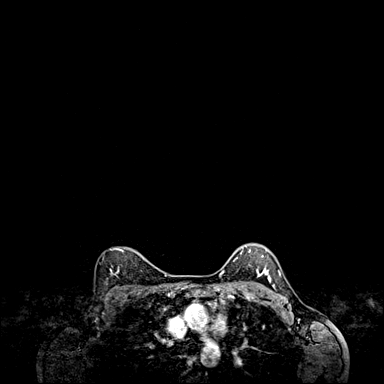
[im 144/144]
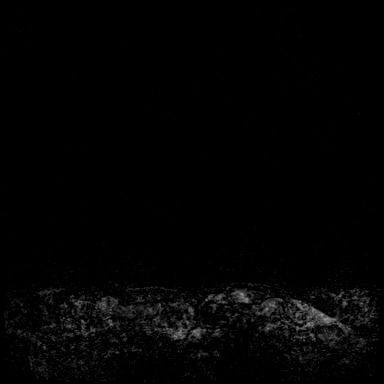

[Series 6: fl3d post immediate · axial · 1.2mm · 0.94mm/px · z∈[-102,+69]mm · 5 of 144 slices shown (2 of 3)]
[im 1/144]
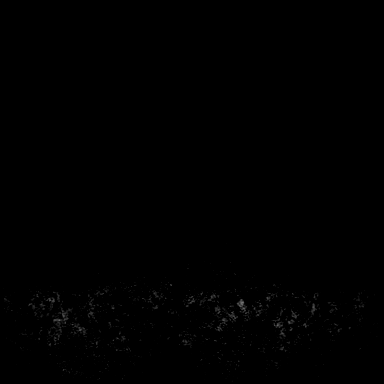
[im 36/144]
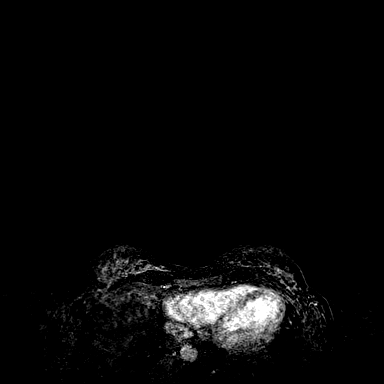
[im 72/144]
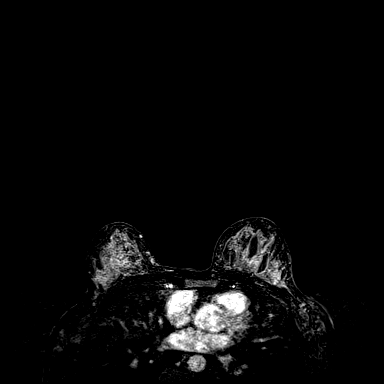
[im 108/144]
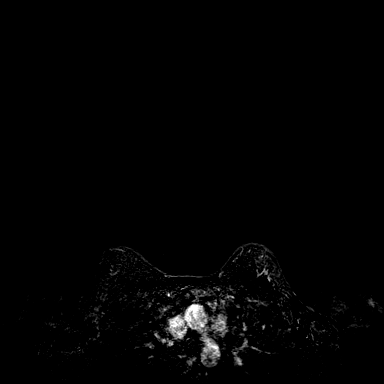
[im 144/144]
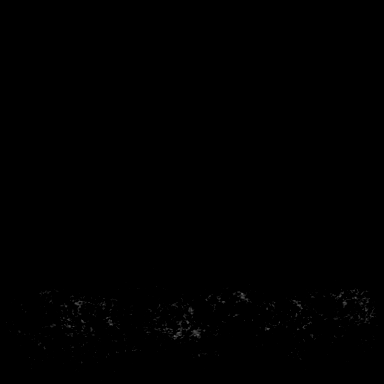

[Series 7: fl3d post immediate · axial · 172.8mm · 0.94mm/px · 1 of 1 slices shown (3 of 3)]
[im 1/1]
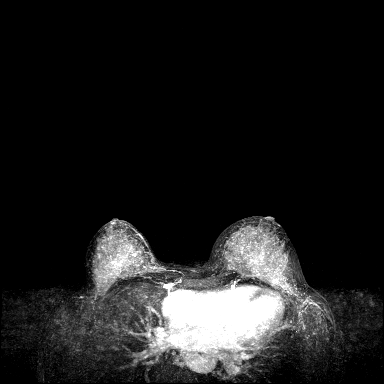

[Series 8: fl3d post 3min · axial · 1.2mm · 0.94mm/px · z∈[-102,+69]mm · 6 of 144 slices shown]
[im 1/144]
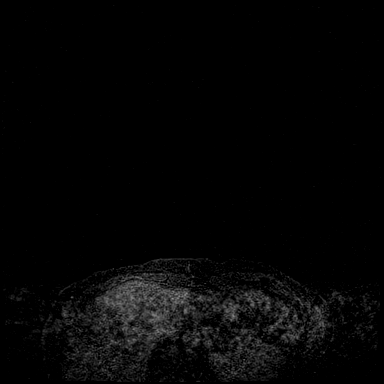
[im 29/144]
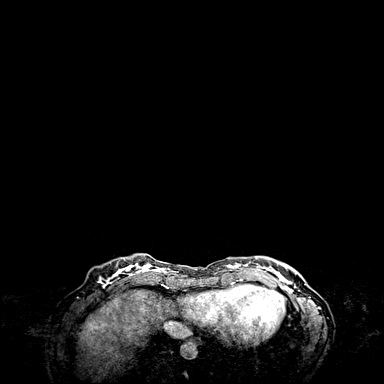
[im 58/144]
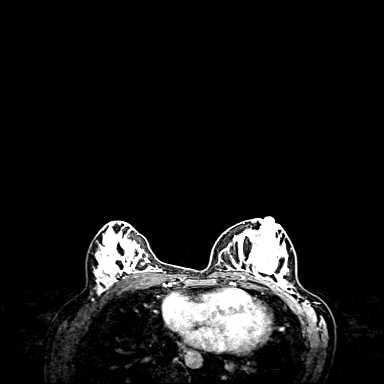
[im 86/144]
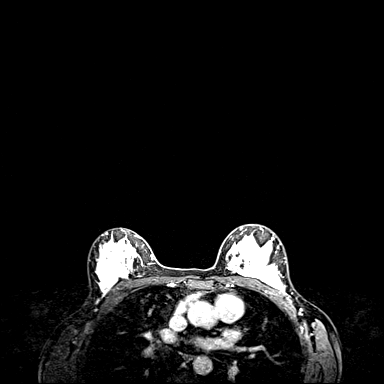
[im 115/144]
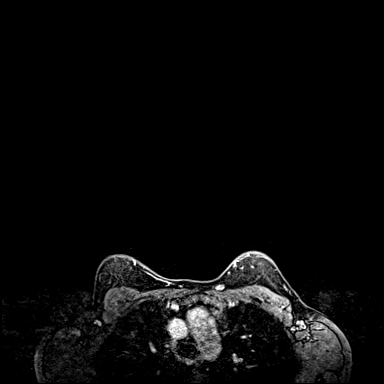
[im 144/144]
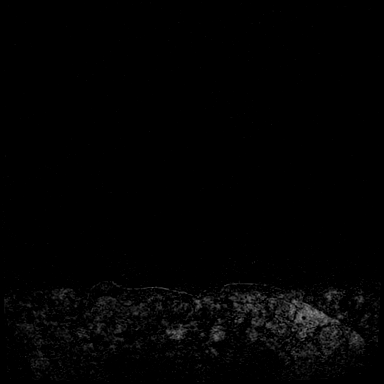

[Series 9: fl3d post 3min_sub · axial · 1.2mm · 0.94mm/px · z∈[-102,-0]mm · 4 of 144 slices shown]
[im 1/144]
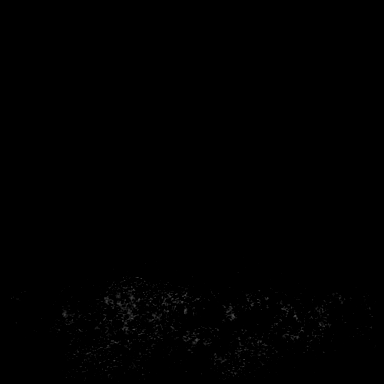
[im 29/144]
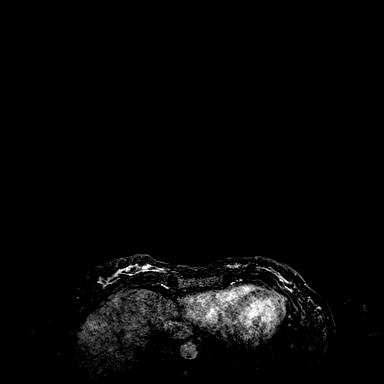
[im 58/144]
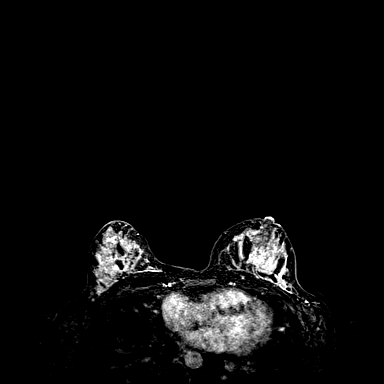
[im 86/144]
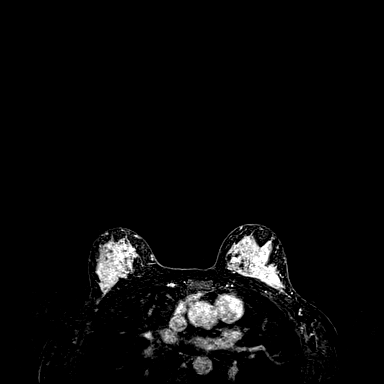

[32 of 48 positions shown; findings below may reference images not displayed]

Three-dimensional MR images were rendered by post-processing of the
original MR data on an independent workstation. The
three-dimensional MR images were interpreted, and findings are
reported in the following complete MRI report for this study. Three
dimensional images were evaluated at the independent interpreting
workstation using the DynaCAD thin client.
FINDINGS: Breast composition: d. Extreme fibroglandular tissue.

Background parenchymal enhancement: Marked

Right breast: Oval circumscribed enhancing mass within the slightly
upper RIGHT breast, at posterior depth, measuring 5 mm, with
suspicious washout enhancement kinetics (series 6, image 79).

Scattered benign cysts.

No additional suspicious enhancing mass, suspicious non-mass
enhancement or secondary signs of malignancy within the RIGHT
breast.

Left breast: Scattered benign cysts. No suspicious enhancing mass,
suspicious non-mass enhancement or secondary signs of malignancy
within the LEFT breast.

Lymph nodes: No abnormal appearing lymph nodes.

Ancillary findings:  None.
IMPRESSION: 1. Oval circumscribed enhancing mass within the upper RIGHT breast,
11-12 o'clock axis region, at posterior depth, with suspicious
washout enhancement kinetics (series 6, image 79). This is a
suspicious finding for which MRI guided biopsy is recommended.
2. No evidence of malignancy within the LEFT breast.

RECOMMENDATION:
MRI-guided biopsy for the 5 mm enhancing mass within the upper RIGHT
breast.

BI-RADS CATEGORY  4: Suspicious.

## 2023-06-22 DIAGNOSIS — M9904 Segmental and somatic dysfunction of sacral region: Secondary | ICD-10-CM | POA: Diagnosis not present

## 2023-06-22 DIAGNOSIS — M9902 Segmental and somatic dysfunction of thoracic region: Secondary | ICD-10-CM | POA: Diagnosis not present

## 2023-07-13 DIAGNOSIS — M9904 Segmental and somatic dysfunction of sacral region: Secondary | ICD-10-CM | POA: Diagnosis not present

## 2023-07-13 DIAGNOSIS — M9902 Segmental and somatic dysfunction of thoracic region: Secondary | ICD-10-CM | POA: Diagnosis not present

## 2023-07-15 DIAGNOSIS — M419 Scoliosis, unspecified: Secondary | ICD-10-CM | POA: Diagnosis not present

## 2023-08-03 DIAGNOSIS — M9902 Segmental and somatic dysfunction of thoracic region: Secondary | ICD-10-CM | POA: Diagnosis not present

## 2023-08-03 DIAGNOSIS — M9904 Segmental and somatic dysfunction of sacral region: Secondary | ICD-10-CM | POA: Diagnosis not present

## 2023-08-18 ENCOUNTER — Other Ambulatory Visit: Payer: Self-pay | Admitting: Nurse Practitioner

## 2023-08-18 DIAGNOSIS — R921 Mammographic calcification found on diagnostic imaging of breast: Secondary | ICD-10-CM

## 2023-08-31 DIAGNOSIS — M9902 Segmental and somatic dysfunction of thoracic region: Secondary | ICD-10-CM | POA: Diagnosis not present

## 2023-08-31 DIAGNOSIS — M9904 Segmental and somatic dysfunction of sacral region: Secondary | ICD-10-CM | POA: Diagnosis not present

## 2023-09-21 DIAGNOSIS — M9902 Segmental and somatic dysfunction of thoracic region: Secondary | ICD-10-CM | POA: Diagnosis not present

## 2023-09-21 DIAGNOSIS — M9904 Segmental and somatic dysfunction of sacral region: Secondary | ICD-10-CM | POA: Diagnosis not present

## 2023-10-01 ENCOUNTER — Ambulatory Visit
Admission: RE | Admit: 2023-10-01 | Discharge: 2023-10-01 | Disposition: A | Discharge status: Home / Self Care | Source: Ambulatory Visit | Attending: Nurse Practitioner | Admitting: Nurse Practitioner

## 2023-10-01 DIAGNOSIS — R921 Mammographic calcification found on diagnostic imaging of breast: Secondary | ICD-10-CM | POA: Diagnosis not present

## 2023-10-12 DIAGNOSIS — M9904 Segmental and somatic dysfunction of sacral region: Secondary | ICD-10-CM | POA: Diagnosis not present

## 2023-10-12 DIAGNOSIS — M9902 Segmental and somatic dysfunction of thoracic region: Secondary | ICD-10-CM | POA: Diagnosis not present

## 2023-11-09 DIAGNOSIS — M9902 Segmental and somatic dysfunction of thoracic region: Secondary | ICD-10-CM | POA: Diagnosis not present

## 2023-11-09 DIAGNOSIS — M9904 Segmental and somatic dysfunction of sacral region: Secondary | ICD-10-CM | POA: Diagnosis not present

## 2023-11-29 DIAGNOSIS — Z01419 Encounter for gynecological examination (general) (routine) without abnormal findings: Secondary | ICD-10-CM | POA: Diagnosis not present

## 2023-11-29 DIAGNOSIS — Z124 Encounter for screening for malignant neoplasm of cervix: Secondary | ICD-10-CM | POA: Diagnosis not present

## 2023-11-30 DIAGNOSIS — M9902 Segmental and somatic dysfunction of thoracic region: Secondary | ICD-10-CM | POA: Diagnosis not present

## 2023-11-30 DIAGNOSIS — M9904 Segmental and somatic dysfunction of sacral region: Secondary | ICD-10-CM | POA: Diagnosis not present

## 2023-12-10 ENCOUNTER — Telehealth: Payer: Self-pay | Admitting: Internal Medicine

## 2023-12-10 DIAGNOSIS — J320 Chronic maxillary sinusitis: Secondary | ICD-10-CM

## 2023-12-10 MED ORDER — CLOTRIMAZOLE-BETAMETHASONE 1-0.05 % EX CREA: 1.0000 | TOPICAL_CREAM | Freq: Two times a day (BID) | CUTANEOUS | 1 refills | Status: AC

## 2023-12-10 MED ORDER — AMOXICILLIN-POT CLAVULANATE 875-125 MG PO TABS: 1.0000 | ORAL_TABLET | Freq: Two times a day (BID) | ORAL | 0 refills | Status: AC

## 2023-12-10 NOTE — Telephone Encounter (Signed)
 Sinusitis persistent and eczema rash, [place] Rx 14d augmentin in case recurrence Rx lotrisome cream for leg RTC if no improvement

## 2024-01-03 DIAGNOSIS — M9904 Segmental and somatic dysfunction of sacral region: Secondary | ICD-10-CM | POA: Diagnosis not present

## 2024-01-03 DIAGNOSIS — M9902 Segmental and somatic dysfunction of thoracic region: Secondary | ICD-10-CM | POA: Diagnosis not present

## 2024-02-07 DIAGNOSIS — M9902 Segmental and somatic dysfunction of thoracic region: Secondary | ICD-10-CM | POA: Diagnosis not present

## 2024-02-07 DIAGNOSIS — M9904 Segmental and somatic dysfunction of sacral region: Secondary | ICD-10-CM | POA: Diagnosis not present
